# Patient Record
Sex: Female | Born: 1935 | Race: White | Hispanic: No | Smoking: Former smoker
Health system: Southern US, Community
[De-identification: ages and names within clinical notes are randomized; demographics above are authoritative.]

## PROBLEM LIST (undated history)

## (undated) DIAGNOSIS — E119 Type 2 diabetes mellitus without complications: Secondary | ICD-10-CM

## (undated) DIAGNOSIS — F329 Major depressive disorder, single episode, unspecified: Secondary | ICD-10-CM

## (undated) DIAGNOSIS — J45909 Unspecified asthma, uncomplicated: Secondary | ICD-10-CM

## (undated) DIAGNOSIS — Z9989 Dependence on other enabling machines and devices: Secondary | ICD-10-CM

## (undated) DIAGNOSIS — E11319 Type 2 diabetes mellitus with unspecified diabetic retinopathy without macular edema: Secondary | ICD-10-CM

## (undated) DIAGNOSIS — F32A Depression, unspecified: Secondary | ICD-10-CM

## (undated) DIAGNOSIS — E785 Hyperlipidemia, unspecified: Secondary | ICD-10-CM

## (undated) DIAGNOSIS — G4733 Obstructive sleep apnea (adult) (pediatric): Secondary | ICD-10-CM

## (undated) DIAGNOSIS — I10 Essential (primary) hypertension: Secondary | ICD-10-CM

## (undated) DIAGNOSIS — G4700 Insomnia, unspecified: Secondary | ICD-10-CM

## (undated) DIAGNOSIS — M179 Osteoarthritis of knee, unspecified: Secondary | ICD-10-CM

## (undated) DIAGNOSIS — M1710 Unilateral primary osteoarthritis, unspecified knee: Secondary | ICD-10-CM

## (undated) HISTORY — DX: Essential (primary) hypertension: I10

## (undated) HISTORY — DX: Hyperlipidemia, unspecified: E78.5

## (undated) HISTORY — DX: Depression, unspecified: F32.A

## (undated) HISTORY — DX: Obstructive sleep apnea (adult) (pediatric): G47.33

## (undated) HISTORY — DX: Unilateral primary osteoarthritis, unspecified knee: M17.10

## (undated) HISTORY — DX: Dependence on other enabling machines and devices: Z99.89

## (undated) HISTORY — PX: EYE SURGERY: SHX253

## (undated) HISTORY — DX: Osteoarthritis of knee, unspecified: M17.9

## (undated) HISTORY — DX: Major depressive disorder, single episode, unspecified: F32.9

## (undated) HISTORY — DX: Insomnia, unspecified: G47.00

## (undated) HISTORY — DX: Type 2 diabetes mellitus with unspecified diabetic retinopathy without macular edema: E11.319

## (undated) HISTORY — DX: Type 2 diabetes mellitus without complications: E11.9

## (undated) HISTORY — PX: APPENDECTOMY: SHX54

---

## 1999-07-19 ENCOUNTER — Other Ambulatory Visit: Admission: RE | Admit: 1999-07-19 | Discharge: 1999-07-19 | Payer: Self-pay | Admitting: Family Medicine

## 1999-09-15 ENCOUNTER — Other Ambulatory Visit: Admission: RE | Admit: 1999-09-15 | Discharge: 1999-09-15 | Payer: Self-pay | Admitting: Family Medicine

## 2002-02-14 ENCOUNTER — Ambulatory Visit (HOSPITAL_BASED_OUTPATIENT_CLINIC_OR_DEPARTMENT_OTHER): Admission: RE | Admit: 2002-02-14 | Discharge: 2002-02-14 | Payer: Self-pay | Admitting: Family Medicine

## 2002-08-15 ENCOUNTER — Ambulatory Visit (HOSPITAL_BASED_OUTPATIENT_CLINIC_OR_DEPARTMENT_OTHER): Admission: RE | Admit: 2002-08-15 | Discharge: 2002-08-15 | Payer: Self-pay | Admitting: Family Medicine

## 2003-11-02 ENCOUNTER — Ambulatory Visit (HOSPITAL_COMMUNITY): Admission: RE | Admit: 2003-11-02 | Discharge: 2003-11-02 | Payer: Self-pay | Admitting: Family Medicine

## 2004-11-21 ENCOUNTER — Emergency Department (HOSPITAL_COMMUNITY): Admission: EM | Admit: 2004-11-21 | Discharge: 2004-11-21 | Payer: Self-pay | Admitting: Emergency Medicine

## 2004-12-16 ENCOUNTER — Encounter: Admission: RE | Admit: 2004-12-16 | Discharge: 2004-12-16 | Payer: Self-pay | Admitting: Family Medicine

## 2005-01-24 ENCOUNTER — Encounter: Admission: RE | Admit: 2005-01-24 | Discharge: 2005-01-24 | Payer: Self-pay | Admitting: Family Medicine

## 2005-04-20 ENCOUNTER — Ambulatory Visit (HOSPITAL_COMMUNITY): Admission: RE | Admit: 2005-04-20 | Discharge: 2005-04-20 | Payer: Self-pay | Admitting: Family Medicine

## 2005-11-06 ENCOUNTER — Emergency Department (HOSPITAL_COMMUNITY): Admission: EM | Admit: 2005-11-06 | Discharge: 2005-11-06 | Payer: Self-pay | Admitting: Emergency Medicine

## 2005-11-27 ENCOUNTER — Ambulatory Visit: Payer: Self-pay | Admitting: Orthopedic Surgery

## 2005-12-25 ENCOUNTER — Ambulatory Visit: Payer: Self-pay | Admitting: Orthopedic Surgery

## 2009-01-14 ENCOUNTER — Ambulatory Visit (HOSPITAL_COMMUNITY): Admission: RE | Admit: 2009-01-14 | Discharge: 2009-01-14 | Payer: Self-pay | Admitting: Orthopaedic Surgery

## 2010-10-23 ENCOUNTER — Encounter: Payer: Self-pay | Admitting: Family Medicine

## 2012-12-23 ENCOUNTER — Other Ambulatory Visit: Payer: Self-pay | Admitting: Family Medicine

## 2013-04-14 ENCOUNTER — Ambulatory Visit (INDEPENDENT_AMBULATORY_CARE_PROVIDER_SITE_OTHER): Payer: Medicare Other | Admitting: Family Medicine

## 2013-04-14 ENCOUNTER — Encounter: Payer: Self-pay | Admitting: Family Medicine

## 2013-04-14 ENCOUNTER — Other Ambulatory Visit: Payer: Self-pay | Admitting: Family Medicine

## 2013-04-14 VITALS — BP 126/80 | HR 88 | Temp 99.0°F | Resp 22 | Wt 218.0 lb

## 2013-04-14 DIAGNOSIS — R011 Cardiac murmur, unspecified: Secondary | ICD-10-CM

## 2013-04-14 DIAGNOSIS — E785 Hyperlipidemia, unspecified: Secondary | ICD-10-CM | POA: Insufficient documentation

## 2013-04-14 DIAGNOSIS — E119 Type 2 diabetes mellitus without complications: Secondary | ICD-10-CM | POA: Insufficient documentation

## 2013-04-14 DIAGNOSIS — M1710 Unilateral primary osteoarthritis, unspecified knee: Secondary | ICD-10-CM | POA: Insufficient documentation

## 2013-04-14 DIAGNOSIS — G4700 Insomnia, unspecified: Secondary | ICD-10-CM | POA: Insufficient documentation

## 2013-04-14 DIAGNOSIS — F329 Major depressive disorder, single episode, unspecified: Secondary | ICD-10-CM | POA: Insufficient documentation

## 2013-04-14 DIAGNOSIS — I10 Essential (primary) hypertension: Secondary | ICD-10-CM | POA: Insufficient documentation

## 2013-04-14 NOTE — Progress Notes (Signed)
Subjective:    Patient ID: Diana Colon, female    DOB: [birth year], 79 y.o.   MRN: [id number]  HPI Patient has not been seen in 15 months.  She is brought in by her daughter who is concerned because she's not taking care of herself. She has been seen by her eye doctor and found to have significant diabetic retinopathy.  She is here today for diabetes management. She is currently taking Lantus 50 units subcutaneous daily. She is not checking her sugars at all. She has no fasting blood sugars and no two-hour postprandial sugars.  She does report dyspnea on exertion and peripheral edema. He denies dysesthesias or paresthesias in the legs. She denies nausea vomiting diarrhea. She denies polyuria, blurred vision, polydipsia. Past Medical History  Diagnosis Date  . Depression   . Diabetes mellitus without complication   . Hyperlipidemia   . Insomnia   . DJD (degenerative joint disease) of knee   . Hypertension    No current outpatient prescriptions on file prior to visit.   No current facility-administered medications on file prior to visit.   Allergies  Allergen Reactions  . Ace Inhibitors   . Codeine    History   Social History  . Marital Status: Widowed    Spouse Name: N/A    Number of Children: N/A  . Years of Education: N/A   Occupational History  . Not on file.   Social History Main Topics  . Smoking status: Former Smoker    Types: Cigarettes  . Smokeless tobacco: Not on file  . Alcohol Use: No  . Drug Use: No  . Sexually Active: Not on file   Other Topics Concern  . Not on file   Social History Narrative  . No narrative on file   .    Review of Systems  All other systems reviewed and are negative.       Objective:   Physical Exam  Vitals reviewed. Constitutional: She is oriented to person, place, and time. She appears well-developed and well-nourished.  Cardiovascular: Normal rate and intact distal pulses.   Murmur (3/ 6 systolic ejection murmur  radiating into both carotid arteries) heard. Pulmonary/Chest: Effort normal and breath sounds normal. No respiratory distress. She has no wheezes. She has no rales. She exhibits no tenderness.  Abdominal: Soft. Bowel sounds are normal. She exhibits no distension and no mass. There is no tenderness. There is no rebound and no guarding.  Neurological: She is alert and oriented to person, place, and time. She has normal reflexes. She displays normal reflexes. No cranial nerve deficit. Coordination normal.  Skin: Skin is warm. No rash noted. No erythema. No pallor.   diabetic foot exam is performed.       Assessment & Plan:  1. Type II or unspecified type diabetes mellitus without mention of complication, uncontrolled Check hemoglobin A1c to get an idea of how well controlled her diabetes is.  Also check a urine microalbumin. If it is positive, I would start an ARB.  I also recommend aspirin 81 mg by mouth daily. She will need to return fasting to check her lipid panel.  - COMPLETE METABOLIC PANEL WITH GFR - CBC with Differential - Hemoglobin A1c - Microalbumin, urine  2. Undiagnosed cardiac murmurs Patient has aortic stenosis. I obtain a 2-D echocardiogram to evaluate objectively.  This may be the source of the patient's peripheral edema.    She is overdue for a mammogram, shingles shot, colonoscopy, and bone density. However  the patient is noncompliant and has not followed up as recommended.  I will defer these tests for now until her diabetes is stabilized and her cardiac condition better characterized. - 2D Echocardiogram with contrast; Future

## 2013-04-15 ENCOUNTER — Other Ambulatory Visit: Payer: Self-pay | Admitting: Family Medicine

## 2013-04-15 DIAGNOSIS — R011 Cardiac murmur, unspecified: Secondary | ICD-10-CM

## 2013-04-15 LAB — COMPLETE METABOLIC PANEL WITH GFR
ALT: 12 U/L (ref 0–35)
AST: 15 U/L (ref 0–37)
Alkaline Phosphatase: 50 U/L (ref 39–117)
CO2: 29 mEq/L (ref 19–32)
Creat: 1.12 mg/dL — ABNORMAL HIGH (ref 0.50–1.10)
GFR, Est African American: 53 mL/min — ABNORMAL LOW
Sodium: 147 mEq/L — ABNORMAL HIGH (ref 135–145)
Total Bilirubin: 0.4 mg/dL (ref 0.3–1.2)
Total Protein: 6.2 g/dL (ref 6.0–8.3)

## 2013-04-15 LAB — CBC WITH DIFFERENTIAL/PLATELET
Eosinophils Absolute: 0.9 10*3/uL — ABNORMAL HIGH (ref 0.0–0.7)
HCT: 40 % (ref 36.0–46.0)
Hemoglobin: 13.1 g/dL (ref 12.0–15.0)
Lymphs Abs: 1.7 10*3/uL (ref 0.7–4.0)
MCH: 30 pg (ref 26.0–34.0)
MCHC: 32.8 g/dL (ref 30.0–36.0)
Monocytes Absolute: 0.6 10*3/uL (ref 0.1–1.0)
Monocytes Relative: 6 % (ref 3–12)
Neutrophils Relative %: 66 % (ref 43–77)
RBC: 4.37 MIL/uL (ref 3.87–5.11)

## 2013-04-15 LAB — HEMOGLOBIN A1C
Hgb A1c MFr Bld: 6.2 % — ABNORMAL HIGH (ref <5.7)
Mean Plasma Glucose: 131 mg/dL — ABNORMAL HIGH (ref <117)

## 2013-04-16 ENCOUNTER — Other Ambulatory Visit: Payer: Self-pay | Admitting: Family Medicine

## 2013-04-18 NOTE — Progress Notes (Signed)
[facility] 04/16/13 and today

## 2013-04-22 ENCOUNTER — Other Ambulatory Visit (HOSPITAL_COMMUNITY): Payer: Self-pay

## 2013-04-23 ENCOUNTER — Ambulatory Visit (HOSPITAL_COMMUNITY): Payer: Medicare Other | Attending: Family Medicine | Admitting: Radiology

## 2013-04-23 VITALS — Ht 62.0 in

## 2013-04-23 DIAGNOSIS — R0609 Other forms of dyspnea: Secondary | ICD-10-CM | POA: Insufficient documentation

## 2013-04-23 DIAGNOSIS — I10 Essential (primary) hypertension: Secondary | ICD-10-CM | POA: Insufficient documentation

## 2013-04-23 DIAGNOSIS — E119 Type 2 diabetes mellitus without complications: Secondary | ICD-10-CM | POA: Insufficient documentation

## 2013-04-23 DIAGNOSIS — R0989 Other specified symptoms and signs involving the circulatory and respiratory systems: Secondary | ICD-10-CM | POA: Insufficient documentation

## 2013-04-23 DIAGNOSIS — R011 Cardiac murmur, unspecified: Secondary | ICD-10-CM

## 2013-04-23 DIAGNOSIS — E785 Hyperlipidemia, unspecified: Secondary | ICD-10-CM | POA: Insufficient documentation

## 2013-04-23 DIAGNOSIS — E669 Obesity, unspecified: Secondary | ICD-10-CM | POA: Insufficient documentation

## 2013-04-23 NOTE — Progress Notes (Signed)
Echocardiogram performed.  

## 2013-04-28 ENCOUNTER — Other Ambulatory Visit: Payer: Self-pay | Admitting: Family Medicine

## 2013-04-28 MED ORDER — AMLODIPINE BESYLATE 5 MG PO TABS: 5.0000 mg | ORAL_TABLET | Freq: Every day | ORAL | Status: DC

## 2013-04-28 NOTE — Telephone Encounter (Signed)
Rx Refilled  

## 2013-05-19 ENCOUNTER — Telehealth: Payer: Self-pay | Admitting: Family Medicine

## 2013-05-19 MED ORDER — INSULIN GLARGINE 100 UNIT/ML SOLOSTAR PEN: PEN_INJECTOR | SUBCUTANEOUS | Status: DC

## 2013-05-19 NOTE — Telephone Encounter (Signed)
Lantus Solostar Pens (pt wants #45 = 3 boxes of 5 pens each (3mL)

## 2013-05-19 NOTE — Telephone Encounter (Signed)
Rx Refilled  

## 2013-09-12 ENCOUNTER — Ambulatory Visit (HOSPITAL_COMMUNITY)
Admission: RE | Admit: 2013-09-12 | Discharge: 2013-09-12 | Disposition: A | Discharge status: Home / Self Care | Payer: Medicare Other | Source: Ambulatory Visit | Attending: Family Medicine | Admitting: Family Medicine

## 2013-09-12 ENCOUNTER — Ambulatory Visit (INDEPENDENT_AMBULATORY_CARE_PROVIDER_SITE_OTHER): Payer: Medicare Other | Admitting: Family Medicine

## 2013-09-12 ENCOUNTER — Encounter: Payer: Self-pay | Admitting: Family Medicine

## 2013-09-12 VITALS — BP 130/78 | HR 72 | Temp 98.6°F | Resp 18 | Wt 226.0 lb

## 2013-09-12 DIAGNOSIS — R059 Cough, unspecified: Secondary | ICD-10-CM

## 2013-09-12 DIAGNOSIS — K219 Gastro-esophageal reflux disease without esophagitis: Secondary | ICD-10-CM

## 2013-09-12 DIAGNOSIS — R05 Cough: Secondary | ICD-10-CM | POA: Insufficient documentation

## 2013-09-12 DIAGNOSIS — J9819 Other pulmonary collapse: Secondary | ICD-10-CM | POA: Insufficient documentation

## 2013-09-12 DIAGNOSIS — Z23 Encounter for immunization: Secondary | ICD-10-CM

## 2013-09-12 DIAGNOSIS — E119 Type 2 diabetes mellitus without complications: Secondary | ICD-10-CM

## 2013-09-12 DIAGNOSIS — Z87891 Personal history of nicotine dependence: Secondary | ICD-10-CM | POA: Insufficient documentation

## 2013-09-12 DIAGNOSIS — I10 Essential (primary) hypertension: Secondary | ICD-10-CM | POA: Insufficient documentation

## 2013-09-12 DIAGNOSIS — J45909 Unspecified asthma, uncomplicated: Secondary | ICD-10-CM | POA: Insufficient documentation

## 2013-09-12 DIAGNOSIS — I517 Cardiomegaly: Secondary | ICD-10-CM | POA: Insufficient documentation

## 2013-09-12 DIAGNOSIS — G4733 Obstructive sleep apnea (adult) (pediatric): Secondary | ICD-10-CM

## 2013-09-12 LAB — CBC WITH DIFFERENTIAL/PLATELET
Basophils Absolute: 0.1 10*3/uL (ref 0.0–0.1)
Basophils Relative: 1 % (ref 0–1)
Eosinophils Absolute: 1 10*3/uL — ABNORMAL HIGH (ref 0.0–0.7)
Eosinophils Relative: 10 % — ABNORMAL HIGH (ref 0–5)
HCT: 38.2 % (ref 36.0–46.0)
MCHC: 33.8 g/dL (ref 30.0–36.0)
MCV: 89 fL (ref 78.0–100.0)
Monocytes Absolute: 0.7 10*3/uL (ref 0.1–1.0)
Platelets: 301 10*3/uL (ref 150–400)
RDW: 12.9 % (ref 11.5–15.5)

## 2013-09-12 LAB — COMPLETE METABOLIC PANEL WITH GFR
AST: 15 U/L (ref 0–37)
Albumin: 3.3 g/dL — ABNORMAL LOW (ref 3.5–5.2)
Alkaline Phosphatase: 53 U/L (ref 39–117)
Calcium: 9.1 mg/dL (ref 8.4–10.5)
Chloride: 101 mEq/L (ref 96–112)
Glucose, Bld: 124 mg/dL — ABNORMAL HIGH (ref 70–99)
Potassium: 4.3 mEq/L (ref 3.5–5.3)
Sodium: 139 mEq/L (ref 135–145)
Total Protein: 6.2 g/dL (ref 6.0–8.3)

## 2013-09-12 LAB — HEMOGLOBIN A1C
Hgb A1c MFr Bld: 7.3 % — ABNORMAL HIGH (ref <5.7)
Mean Plasma Glucose: 163 mg/dL — ABNORMAL HIGH (ref <117)

## 2013-09-12 LAB — LIPID PANEL: LDL Cholesterol: 109 mg/dL — ABNORMAL HIGH (ref 0–99)

## 2013-09-12 MED ORDER — PANTOPRAZOLE SODIUM 40 MG PO TBEC: 40.0000 mg | DELAYED_RELEASE_TABLET | Freq: Two times a day (BID) | ORAL | Status: DC

## 2013-09-12 NOTE — Progress Notes (Signed)
Subjective:    Patient ID: Diana Colon, female    DOB: January 23, 1929, 79 y.o.   MRN: [id number]  HPI  Patient presents today for routine followup. Of note she complains of a cough that is nonproductive the last 3 years. She states the cough is worse first thing in the morning. She denies hemoptysis, weight loss, fevers, or night sweats. She denies any shortness of breath. She does report daily reflux that is severe. She denies a postnasal drip. She has a remote history of tobacco abuse but this was over 40 years ago and was only for a short period time. She denies any wheezing or history of asthma. At her last office visit her hemoglobin A1c was found to be 6.2 which is as well controlled. This is in spite of the fact the patient is extremely noncompliant and never checks her blood sugars. Her blood pressure currently well controlled 130/78. She is due for a flu shot as well as Prevnar 13. She is also due for her fasting lipid panel. In 2003, she was found to have obstructive sleep apnea sleep study. She never got the equipment and she has not been using it. Now she complains of hypersomnolence, snoring, and fatigue. She is interested in being rechecked for sleep apnea so that he can be treated.  Over 11 years ago she was titrated to a continuous water pressure of 8. Past Medical History  Diagnosis Date  . Depression   . Diabetes mellitus without complication   . Hyperlipidemia   . Insomnia   . DJD (degenerative joint disease) of knee   . Hypertension    No past surgical history on file. Current Outpatient Prescriptions on File Prior to Visit  Medication Sig Dispense Refill  . amLODipine (NORVASC) 5 MG tablet Take 1 tablet (5 mg total) by mouth daily.  30 tablet  5  . aspirin 81 MG tablet Take 81 mg by mouth daily.      . Cyanocobalamin (VITAMIN B 12 PO) Take by mouth.      . escitalopram (LEXAPRO) 10 MG tablet 1 po qd  30 tablet  5  . Insulin Glargine (LANTUS SOLOSTAR) 100 UNIT/ML SOPN INJECT  50 UNITS NIGHTLY AT BEDTIME  45 pen  1   No current facility-administered medications on file prior to visit.   Allergies  Allergen Reactions  . Ace Inhibitors   . Codeine    History   Social History  . Marital Status: Widowed    Spouse Name: N/A    Number of Children: N/A  . Years of Education: N/A   Occupational History  . Not on file.   Social History Main Topics  . Smoking status: Former Smoker    Types: Cigarettes  . Smokeless tobacco: Not on file  . Alcohol Use: No  . Drug Use: No  . Sexual Activity: Not on file   Other Topics Concern  . Not on file   Social History Narrative  . No narrative on file     Review of Systems  All other systems reviewed and are negative.       Objective:   Physical Exam  Vitals reviewed. Constitutional: She is oriented to person, place, and time. She appears well-developed and well-nourished.  Eyes: Conjunctivae are normal. Pupils are equal, round, and reactive to light.  Neck: Normal range of motion. No JVD present. No thyromegaly present.  Cardiovascular: Normal rate, regular rhythm and normal heart sounds.   No murmur heard. Pulmonary/Chest: Effort  normal and breath sounds normal. No respiratory distress. She has no wheezes. She has no rales. She exhibits no tenderness.  Abdominal: Soft. Bowel sounds are normal. She exhibits no distension and no mass. There is no tenderness. There is no rebound and no guarding.  Musculoskeletal: She exhibits no edema.  Lymphadenopathy:    She has no cervical adenopathy.  Neurological: She is alert and oriented to person, place, and time.          Assessment & Plan:  1. GERD (gastroesophageal reflux disease) Begin protonix 40 mg by mouth twice a day.  I believe this is likely the cause of patient's chronic cough. I would like to recheck her back in one month - pantoprazole (PROTONIX) 40 MG tablet; Take 1 tablet (40 mg total) by mouth 2 (two) times daily.  Dispense: 60 tablet;  Refill: 3  2. Cough I believe laryngo-esophageal reflux as most likely cause for her chronic cough. I will start PPI twice daily and recheck the patient in one month. I will also obtain a chest - DG Chest 2 View; Future  3. Type II or unspecified type diabetes mellitus without mention of complication, not stated as uncontrolled This is complicated by extreme noncompliance. Functionally last time her A1c was well controlled. I will check a hemoglobin A1c today. Her goal hemoglobin A1c is less than 7. Also recommended patient should receive a flu shot as well as Prevnar 13. She agreed to both vaccinations the - COMPLETE METABOLIC PANEL WITH GFR - CBC with Differential - Lipid panel - Hemoglobin A1c  4. Need for prophylactic vaccination and inoculation against influenza - Flu Vaccine QUAD 36+ mos IM  5. Need for prophylactic vaccination against Streptococcus pneumoniae (pneumococcus) - Pneumococcal conjugate vaccine 13-valent IM  6. Obstructive sleep apnea I will schedule patient for a sleep study.  I urged compliance with therapy. - Ambulatory referral to Sleep Studies

## 2013-09-16 ENCOUNTER — Other Ambulatory Visit: Payer: Self-pay | Admitting: Family Medicine

## 2013-09-16 DIAGNOSIS — G4733 Obstructive sleep apnea (adult) (pediatric): Secondary | ICD-10-CM

## 2013-09-17 ENCOUNTER — Other Ambulatory Visit: Payer: Self-pay | Admitting: Family Medicine

## 2013-09-17 MED ORDER — PRAVASTATIN SODIUM 20 MG PO TABS: 20.0000 mg | ORAL_TABLET | Freq: Every day | ORAL | Status: DC

## 2013-09-17 NOTE — Telephone Encounter (Signed)
New med sent to pharm. 

## 2013-10-22 ENCOUNTER — Other Ambulatory Visit: Payer: Self-pay | Admitting: Family Medicine

## 2013-10-23 ENCOUNTER — Ambulatory Visit (HOSPITAL_BASED_OUTPATIENT_CLINIC_OR_DEPARTMENT_OTHER): Payer: Medicare Other | Attending: Family Medicine | Admitting: Radiology

## 2013-10-23 VITALS — Ht 61.0 in | Wt 226.0 lb

## 2013-10-23 DIAGNOSIS — R0989 Other specified symptoms and signs involving the circulatory and respiratory systems: Secondary | ICD-10-CM | POA: Insufficient documentation

## 2013-10-23 DIAGNOSIS — R0609 Other forms of dyspnea: Secondary | ICD-10-CM | POA: Insufficient documentation

## 2013-10-23 DIAGNOSIS — G4733 Obstructive sleep apnea (adult) (pediatric): Secondary | ICD-10-CM

## 2013-10-23 DIAGNOSIS — Z6841 Body Mass Index (BMI) 40.0 and over, adult: Secondary | ICD-10-CM | POA: Insufficient documentation

## 2013-10-23 DIAGNOSIS — G4761 Periodic limb movement disorder: Secondary | ICD-10-CM | POA: Insufficient documentation

## 2013-10-25 DIAGNOSIS — G4733 Obstructive sleep apnea (adult) (pediatric): Secondary | ICD-10-CM

## 2013-10-25 NOTE — Sleep Study (Signed)
   NAME: Diana Colon DATE OF BIRTH:  1929/03/25 MEDICAL RECORD NUMBER [id number]  LOCATION: [facility]  PHYSICIAN: YOUNG,CLINTON D  DATE OF STUDY: 10/23/2013  SLEEP STUDY TYPE: Nocturnal Polysomnogram               REFERRING PHYSICIAN: Susy Frizzle, MD  INDICATION FOR STUDY: Hypersomnia with sleep apnea  EPWORTH SLEEPINESS SCORE:   14/24 HEIGHT: 5\' 1"  (154.9 cm)  WEIGHT: 226 lb (102.513 kg)    Body mass index is 42.72 kg/(m^2).  NECK SIZE: 17 in.  MEDICATIONS: Charted for review  SLEEP ARCHITECTURE: Split study protocol. During the diagnostic phase, total sleep time 122 minutes with sleep efficiency 57%. Stage I was 10.2%, stage II 89.8%, stage III and REM were absent. Sleep latency 22.5 minutes, awake after sleep onset 70 minutes, arousal index 47.7, bedtime medication: None.  RESPIRATORY DATA: Apnea hypopnea index (AHI) 36.9 per hour. 75 events were scored including 6 obstructive apneas, 1 central apnea, 68 hypopneas. All events were associated with supine sleep position. CPAP was titrated to 10 CWP, AHI 0.6 per hour. She wore a medium ResMed air fit F10 fullface mask with heated humidifier.  OXYGEN DATA: Moderate snoring with oxygen desaturation to a nadir of 85% on room air. With CPAP control, snoring was prevented and mean oxygen saturation held 93.9% on room air.  CARDIAC DATA: Normal sinus rhythm  MOVEMENT/PARASOMNIA: Limb jerks with sleep disturbance were noted mainly during the titration phase, which is common. A total of 24 limb jerks were counted of which 16 were associated with arousal or awakening for periodic limb movement with arousal index of 7 per hour.  IMPRESSION/ RECOMMENDATION:   1) Severe obstructive sleep apnea/hypopneas syndrome, AHI 36.9 per hour with supine events. Moderate snoring with oxygen desaturation to a nadir of 85% on room air. 2) Successful CPAP titration to 10 CWP, AHI 0.6 per hour. She wore a medium ResMed air fit F. 10  fullface mask with heated to notify her. Snoring was prevented and mean oxygen saturation held 93.9% on room air. 3) Mild periodic limb movement with sleep disturbance was noted during the titration phase. A total of 24 limb jerks were counted of which 16 were associated with arousal or awakening for periodic limb movement with arousal index of 7 per hour. This is commonly a reflection of sleep disturbance by adjustment of CPAP. If it persists in the home environment then specific therapy such as a trial of Requip or Mirapex might be considered if clinically appropriate.   Signed Baird Lyons M.D. Deneise Lever Diplomate, American Board of Sleep Medicine  ELECTRONICALLY SIGNED ON:  10/25/2013, 12:23 PM [place] PH: [phone]   FX: [phone] [place]

## 2013-10-27 ENCOUNTER — Encounter: Payer: Self-pay | Admitting: Family Medicine

## 2013-10-27 DIAGNOSIS — Z9989 Dependence on other enabling machines and devices: Secondary | ICD-10-CM

## 2013-10-27 DIAGNOSIS — G4733 Obstructive sleep apnea (adult) (pediatric): Secondary | ICD-10-CM | POA: Insufficient documentation

## 2013-11-27 ENCOUNTER — Other Ambulatory Visit: Payer: Self-pay | Admitting: Family Medicine

## 2014-01-05 ENCOUNTER — Other Ambulatory Visit: Payer: Self-pay | Admitting: Family Medicine

## 2014-01-05 NOTE — Telephone Encounter (Signed)
Refill appropriate and filled per protocol. 

## 2014-01-15 ENCOUNTER — Encounter: Payer: Self-pay | Admitting: Family Medicine

## 2014-01-15 ENCOUNTER — Ambulatory Visit (INDEPENDENT_AMBULATORY_CARE_PROVIDER_SITE_OTHER): Payer: Medicare Other | Admitting: Family Medicine

## 2014-01-15 VITALS — BP 128/84 | HR 84 | Temp 98.4°F | Resp 22 | Ht 62.5 in | Wt 221.0 lb

## 2014-01-15 DIAGNOSIS — G4733 Obstructive sleep apnea (adult) (pediatric): Secondary | ICD-10-CM

## 2014-01-15 NOTE — Progress Notes (Signed)
   Subjective:    Patient ID: Diana Colon, female    DOB: 02-Apr-1929, 80 y.o.   MRN: [id number]  HPI  Patient has a history of obstructive sleep apnea. She was titrated to 10 cm of water pressure of CPAP.  The patient has worn the machine for one month.  Her compliance report shows 27/30 days she will CPAP. For more than 24 days she or greater than 4 hours. Her average usage was 6 hours and 24 minutes. The patient is given an honest attempt to the machine. Per se she complains it causes her gums to bleed and dried her mouth out. Humidifier on the device does not appear to be working sufficiently. She states that she does better when she does machine. Past Medical History  Diagnosis Date  . Depression   . Diabetes mellitus without complication   . Hyperlipidemia   . Insomnia   . DJD (degenerative joint disease) of knee   . Hypertension   . OSA on CPAP     10 cwp   Current Outpatient Prescriptions on File Prior to Visit  Medication Sig Dispense Refill  . amLODipine (NORVASC) 5 MG tablet TAKE ONE TABLET BY MOUTH DAILY.  30 tablet  5  . aspirin 81 MG tablet Take 81 mg by mouth daily.      . Cyanocobalamin (VITAMIN B 12 PO) Take by mouth.      . escitalopram (LEXAPRO) 10 MG tablet TAKE ONE TABLET BY MOUTH DAILY.  30 tablet  5  . pantoprazole (PROTONIX) 40 MG tablet Take 1 tablet (40 mg total) by mouth 2 (two) times daily.  60 tablet  3  . pravastatin (PRAVACHOL) 20 MG tablet Take 1 tablet (20 mg total) by mouth daily.  30 tablet  3   No current facility-administered medications on file prior to visit.   Allergies  Allergen Reactions  . Ace Inhibitors   . Codeine    History   Social History  . Marital Status: Widowed    Spouse Name: N/A    Number of Children: N/A  . Years of Education: N/A   Occupational History  . Not on file.   Social History Main Topics  . Smoking status: Former Smoker    Types: Cigarettes  . Smokeless tobacco: Not on file  . Alcohol Use: No  . Drug  Use: No  . Sexual Activity: Not on file   Other Topics Concern  . Not on file   Social History Narrative  . No narrative on file   No family history on file.   Review of Systems  All other systems reviewed and are negative.      Objective:   Physical Exam  Vitals reviewed. Cardiovascular: Normal rate and regular rhythm.   Pulmonary/Chest: Effort normal and breath sounds normal.          Assessment & Plan:  1. Obstructive sleep apnea I explained to the patient the risk of untreated sleep apnea. I as the patient can't be compliant with wearing the CPAP machine. I recommended she call her medical equipment supplier and discussed the problems she is having with machine with him to see if they have an alternative device she can try.

## 2014-01-19 ENCOUNTER — Encounter: Payer: Self-pay | Admitting: Family Medicine

## 2014-03-19 ENCOUNTER — Encounter (HOSPITAL_COMMUNITY): Payer: Self-pay | Admitting: Emergency Medicine

## 2014-03-19 ENCOUNTER — Emergency Department (HOSPITAL_COMMUNITY)
Admission: EM | Admit: 2014-03-19 | Discharge: 2014-03-19 | Disposition: A | Discharge status: Home / Self Care | Payer: Medicare Other | Attending: Emergency Medicine | Admitting: Emergency Medicine

## 2014-03-19 ENCOUNTER — Emergency Department (HOSPITAL_COMMUNITY): Payer: Medicare Other

## 2014-03-19 DIAGNOSIS — W460XXA Contact with hypodermic needle, initial encounter: Secondary | ICD-10-CM | POA: Diagnosis not present

## 2014-03-19 DIAGNOSIS — G4733 Obstructive sleep apnea (adult) (pediatric): Secondary | ICD-10-CM | POA: Insufficient documentation

## 2014-03-19 DIAGNOSIS — Y929 Unspecified place or not applicable: Secondary | ICD-10-CM | POA: Insufficient documentation

## 2014-03-19 DIAGNOSIS — Z79899 Other long term (current) drug therapy: Secondary | ICD-10-CM | POA: Diagnosis not present

## 2014-03-19 DIAGNOSIS — S2095XA Superficial foreign body of unspecified parts of thorax, initial encounter: Secondary | ICD-10-CM | POA: Diagnosis present

## 2014-03-19 DIAGNOSIS — Y9389 Activity, other specified: Secondary | ICD-10-CM | POA: Diagnosis not present

## 2014-03-19 DIAGNOSIS — I10 Essential (primary) hypertension: Secondary | ICD-10-CM | POA: Insufficient documentation

## 2014-03-19 DIAGNOSIS — E119 Type 2 diabetes mellitus without complications: Secondary | ICD-10-CM | POA: Diagnosis not present

## 2014-03-19 DIAGNOSIS — Z9981 Dependence on supplemental oxygen: Secondary | ICD-10-CM | POA: Diagnosis not present

## 2014-03-19 DIAGNOSIS — Z8739 Personal history of other diseases of the musculoskeletal system and connective tissue: Secondary | ICD-10-CM | POA: Diagnosis not present

## 2014-03-19 DIAGNOSIS — Z7982 Long term (current) use of aspirin: Secondary | ICD-10-CM | POA: Insufficient documentation

## 2014-03-19 DIAGNOSIS — F329 Major depressive disorder, single episode, unspecified: Secondary | ICD-10-CM | POA: Insufficient documentation

## 2014-03-19 DIAGNOSIS — F3289 Other specified depressive episodes: Secondary | ICD-10-CM | POA: Insufficient documentation

## 2014-03-19 DIAGNOSIS — Z794 Long term (current) use of insulin: Secondary | ICD-10-CM | POA: Insufficient documentation

## 2014-03-19 DIAGNOSIS — Z87891 Personal history of nicotine dependence: Secondary | ICD-10-CM | POA: Diagnosis not present

## 2014-03-19 DIAGNOSIS — M795 Residual foreign body in soft tissue: Secondary | ICD-10-CM

## 2014-03-19 NOTE — ED Notes (Signed)
Patient states that insulin needle broke off in abdomen. Unable to visualize at triage.

## 2014-03-19 NOTE — Discharge Instructions (Signed)
Call Dr Adline Mango office tomorrow to get an appointment to discuss removing the needle in your abdomen. Return to the ED if you get a fever, increased redness, swelling, pain, or you have drainage of pus.

## 2014-03-19 NOTE — ED Provider Notes (Signed)
CSN: [id number]     Arrival date & time 03/19/14  1922 History   First MD Initiated Contact with Patient 03/19/14 1929     Chief Complaint  Patient presents with  . Foreign Body     (Consider location/radiation/quality/duration/timing/severity/associated sxs/prior Treatment) HPI Patient reports about an hour ago she was injecting herself with her evening insulin. She states she was having trouble getting the plunger to go down and when she took the needle out of the skin there was no needle on the end of the syringe. She indicates the area is in her left upper abdomen. There are 2 small red dots in that area. She states this has never happened before.  PCP Dr Dennard Schaumann  Past Medical History  Diagnosis Date  . Depression   . Diabetes mellitus without complication   . Hyperlipidemia   . Insomnia   . DJD (degenerative joint disease) of knee   . Hypertension   . OSA on CPAP     10 cwp   Past Surgical History  Procedure Laterality Date  . Eye surgery    . Appendectomy     No family history on file. History  Substance Use Topics  . Smoking status: Former Smoker    Types: Cigarettes  . Smokeless tobacco: Not on file  . Alcohol Use: No   Lives at home Lives alone  OB History   Grav Para Term Preterm Abortions TAB SAB Ect Mult Living                 Review of Systems  All other systems reviewed and are negative.     Allergies  Ace inhibitors and Codeine  Home Medications   Prior to Admission medications   Medication Sig Start Date End Date Taking? Authorizing Provider  amLODipine (NORVASC) 5 MG tablet Take 5 mg by mouth daily.   Yes Historical Provider, MD  aspirin 81 MG tablet Take 81 mg by mouth daily.   Yes Historical Provider, MD  Coenzyme Q10 (CO Q 10 PO) Take 1 tablet by mouth daily.   Yes Historical Provider, MD  CORAL CALCIUM PO Take 1 tablet by mouth daily.   Yes Historical Provider, MD  Cyanocobalamin (B-12 PO) Place 1 tablet under the tongue daily.    Yes Historical Provider, MD  escitalopram (LEXAPRO) 10 MG tablet Take 10 mg by mouth daily.   Yes Historical Provider, MD  Insulin Glargine (LANTUS SOLOSTAR) 100 UNIT/ML Solostar Pen Inject 50 Units into the skin every morning.   Yes Historical Provider, MD   BP 159/75  Pulse 72  Temp(Src) 98.5 F (36.9 C) (Oral)  Resp 24  Ht 5\' 2"  (1.575 m)  Wt 217 lb 7 oz (98.629 kg)  BMI 39.76 kg/m2  SpO2 95%  Vital signs normal   Physical Exam  Nursing note and vitals reviewed. Constitutional: She is oriented to person, place, and time. She appears well-developed and well-nourished.  Non-toxic appearance. She does not appear ill. No distress.  HENT:  Head: Normocephalic and atraumatic.  Right Ear: External ear normal.  Left Ear: External ear normal.  Nose: Nose normal. No mucosal edema or rhinorrhea.  Mouth/Throat: Mucous membranes are normal. No dental abscesses or uvula swelling. Oropharyngeal exudate present.  Eyes: Conjunctivae and EOM are normal. Pupils are equal, round, and reactive to light.  Neck: Normal range of motion and full passive range of motion without pain. Neck supple.  Pulmonary/Chest: Effort normal. No respiratory distress. She has no rhonchi. She exhibits no  crepitus.  Abdominal: Soft. Normal appearance and bowel sounds are normal. She exhibits no distension. There is no tenderness. There is no rebound and no guarding.  There are 2 small red dots in the left upper quadrant. Patient states that is where she injected her insulin. When I palpate the area I not feel an obvious foreign body. Patient does seem to feel some tenderness at times when it is palpated. There is nothing visible in the puncture site.  Musculoskeletal: Normal range of motion. She exhibits no edema and no tenderness.  Moves all extremities well.   Neurological: She is alert and oriented to person, place, and time. She has normal strength. No cranial nerve deficit.  Skin: Skin is warm, dry and intact. No rash  noted. No erythema. No pallor.  Psychiatric: She has a normal mood and affect. Her speech is normal and behavior is normal. Her mood appears not anxious.    ED Course  Procedures (including critical care time)     EMERGENCY DEPARTMENT US SOFT TISSUE INTERPRETATION "Study: Limited Ultrasound of the noted body part in comments below"  INDICATIONS: Possible needle foreign body in subcutaneous tissues Multiple views of the body part are obtained with a multi-frequency linear probe  PERFORMED BY:  Myself  IMAGES ARCHIVED?: Yes  SIDE:Left  BODY PART:Abdominal wall  FINDINGS: No obvious foreign body seen  LIMITATIONS:    INTERPRETATION:  No abnormal findings noted  COMMENT:     Labs Review Labs Reviewed - No data to display  Imaging Review Dg Abd 2 Views  03/19/2014   CLINICAL DATA:  Patient's concern that she broke an insulin needle off in the soft tissue of the left side upper abdomen.  EXAM: ABDOMEN - 2 VIEW  COMPARISON:  None.  FINDINGS: Bowel gas pattern is nonobstructive. A linear density overlies the lower chest on the lateral view but is favored to represent artifact. Correlation with history and physical exam recommended. Repeat views or additional imaging can be performed as needed to evaluate this further. A similar density is identified in the soft tissues of the left lower quadrant, also favored to be artifactual.  Chronic wedge compression fracture at T12.  IMPRESSION: 1. Linear density overlying the lower chest as described. This is favored to be artifactual. 2. As needed, further evaluation can be performed.   Electronically Signed   By: Shon Hale M.D.   On: 03/19/2014 20:48     EKG Interpretation None      MDM   Final diagnoses:  Foreign body (FB) in soft tissue    Plan discharge   Rolland Porter, MD, Alanson Aly, MD 03/19/14 2316

## 2014-08-13 ENCOUNTER — Other Ambulatory Visit: Payer: Self-pay | Admitting: Family Medicine

## 2014-08-13 NOTE — Telephone Encounter (Signed)
Refill appropriate and filled per protocol. 

## 2014-09-08 ENCOUNTER — Ambulatory Visit (INDEPENDENT_AMBULATORY_CARE_PROVIDER_SITE_OTHER): Payer: Medicare Other | Admitting: *Deleted

## 2014-09-08 DIAGNOSIS — Z23 Encounter for immunization: Secondary | ICD-10-CM

## 2014-10-26 ENCOUNTER — Telehealth: Payer: Self-pay | Admitting: Family Medicine

## 2014-10-26 MED ORDER — LANCETS 30G MISC: 1.0000 | Freq: Two times a day (BID) | Status: DC

## 2014-10-26 MED ORDER — BLOOD GLUCOSE MONITOR KIT: PACK | Status: DC

## 2014-10-26 MED ORDER — GLUCOSE BLOOD VI STRP: 1.0000 | ORAL_STRIP | Freq: Two times a day (BID) | Status: DC

## 2014-10-26 NOTE — Telephone Encounter (Signed)
Daughter calls.  Pt needs diabetic testing supplies to [employer].  Also beginning 11/02/14  Will have [employer]  ID# [id number].  Is seeing Retinal specialist on 11/04/14 for eye exam for DMV.  Needs approval.  Request to referral coordinator.  Seeing Dr Posey Pronto at [facility].

## 2014-10-27 ENCOUNTER — Other Ambulatory Visit: Payer: Self-pay | Admitting: Family Medicine

## 2014-10-27 MED ORDER — LANCETS 30G MISC: 1.0000 | Freq: Two times a day (BID) | Status: AC

## 2014-10-27 MED ORDER — BLOOD GLUCOSE MONITOR KIT: PACK | Status: AC

## 2014-10-27 MED ORDER — GLUCOSE BLOOD VI STRP: 1.0000 | ORAL_STRIP | Freq: Two times a day (BID) | Status: DC

## 2014-10-27 NOTE — Telephone Encounter (Signed)
Daughter called yesterday to get order for diabetic supplies for mother.  RX was faxed to [place] per their request.  Today have received Rx back.  [place] not contracted with [employer].  Have asked daughter to call [facility] and find out who is.  Let me know and we can resend SF

## 2014-10-27 NOTE — Telephone Encounter (Signed)
Pt has appt on Feb 3 Dr. Jalene Mullet NPI [id number] DX: [phone] faxed [employer] referral to [phone]

## 2014-10-27 NOTE — Telephone Encounter (Signed)
Pt can supplies directly from [facility].  They have sent form and we have completed and returned

## 2014-10-27 NOTE — Telephone Encounter (Signed)
Daughter called back. Pt can get supplies directly from [facility].  [employer] has faxed form.  It has been completed and returned.

## 2014-11-02 NOTE — Telephone Encounter (Signed)
Pt insurance does not kick in until today Feb 1, tried to submit referral thru acuity connect did not recognize pt, I faxed over the submission thru [employer] for authorization. Pending authorization.

## 2014-11-04 NOTE — Telephone Encounter (Signed)
Received authorization from [employer] with authorization [id number] to Dr. Mellody Life [facility]   Dx: [id number] 2 diabetes w severe nonprolf diabetic rtnop w mascular edema  Number of visits: 6  Start Date: 11/04/14 ending date 05/03/15  Paper copy faxed to Retinal specialist

## 2014-11-04 NOTE — Telephone Encounter (Signed)
Went into acuity connect to check to see if patients [employer] is in and submitted referral for Dr. Mellody Life [facility], authorization is still pending

## 2014-11-09 ENCOUNTER — Ambulatory Visit (INDEPENDENT_AMBULATORY_CARE_PROVIDER_SITE_OTHER): Payer: Commercial Managed Care - HMO | Admitting: Family Medicine

## 2014-11-09 ENCOUNTER — Encounter: Payer: Self-pay | Admitting: Family Medicine

## 2014-11-09 VITALS — BP 130/72 | HR 80 | Temp 98.1°F | Resp 20 | Ht 62.0 in | Wt 211.0 lb

## 2014-11-09 DIAGNOSIS — N950 Postmenopausal bleeding: Secondary | ICD-10-CM

## 2014-11-09 LAB — URINALYSIS, ROUTINE W REFLEX MICROSCOPIC
Glucose, UA: NEGATIVE mg/dL
NITRITE: NEGATIVE
PH: 5 (ref 5.0–8.0)
Protein, ur: >300 mg/dL — AB
Specific Gravity, Urine: 1.015 (ref 1.005–1.030)
Urobilinogen, UA: 0.2 mg/dL (ref 0.0–1.0)

## 2014-11-09 LAB — URINALYSIS, MICROSCOPIC ONLY
Casts: NONE SEEN
Crystals: NONE SEEN

## 2014-11-09 NOTE — Progress Notes (Signed)
Subjective:    Patient ID: Diana Colon, female    DOB: 22-Feb-1929, 81 y.o.   MRN: [id number]  HPI Patient presents today complaining of intermittent painless vaginal bleeding for almost 1 year. In fact one month ago she awoke and found that she had bled in her bed. She describes the volume of blood is equal to that of a period.  She denies any pelvic pain or vaginal pain. She denies any weight loss. The bleeding comes and goes. She has not had any blood vaginal bleeding and almost 1 month. She denies any dysuria or hematuria. She is convinced that this is coming from her vagina. Past Medical History  Diagnosis Date  . Depression   . Diabetes mellitus without complication   . Hyperlipidemia   . Insomnia   . DJD (degenerative joint disease) of knee   . Hypertension   . OSA on CPAP     10 cwp   Past Surgical History  Procedure Laterality Date  . Eye surgery    . Appendectomy     Current Outpatient Prescriptions on File Prior to Visit  Medication Sig Dispense Refill  . amLODipine (NORVASC) 5 MG tablet Take 5 mg by mouth daily.    Marland Kitchen aspirin 81 MG tablet Take 81 mg by mouth daily.    . blood glucose meter kit and supplies KIT Dispense based on patient and insurance preference. Use up to four times daily as directed. (FOR ICD-9 250.00, 250.01). 1 each 0  . Coenzyme Q10 (CO Q 10 PO) Take 1 tablet by mouth daily.    Marland Kitchen CORAL CALCIUM PO Take 1 tablet by mouth daily.    . Cyanocobalamin (B-12 PO) Place 1 tablet under the tongue daily.    Marland Kitchen escitalopram (LEXAPRO) 10 MG tablet TAKE ONE TABLET BY MOUTH DAILY. 30 tablet 3  . glucose blood test strip 1 each by Other route 2 (two) times daily. Use as instructed 100 each 11  . Insulin Glargine (LANTUS SOLOSTAR) 100 UNIT/ML Solostar Pen Inject 50 Units into the skin every morning.    . Lancets 30G MISC 1 each by Does not apply route 2 (two) times daily. 100 each 11   No current facility-administered medications on file prior to visit.   Allergies   Allergen Reactions  . Ace Inhibitors   . Codeine    History   Social History  . Marital Status: Widowed    Spouse Name: N/A    Number of Children: N/A  . Years of Education: N/A   Occupational History  . Not on file.   Social History Main Topics  . Smoking status: Former Smoker    Types: Cigarettes  . Smokeless tobacco: Not on file  . Alcohol Use: No  . Drug Use: No  . Sexual Activity: Not on file   Other Topics Concern  . Not on file   Social History Narrative      Review of Systems  All other systems reviewed and are negative.      Objective:   Physical Exam  Cardiovascular: Normal rate, regular rhythm and normal heart sounds.   Pulmonary/Chest: Breath sounds normal. No respiratory distress. She has no wheezes. She has no rales.  Abdominal: Soft. Bowel sounds are normal.  Vitals reviewed.  Vaginal exam reveals intertrigo on the left labia. Speculum exam reveals no source of bleeding. Bimanual exam reveals a normal cervix and texture. Due to the patient's body habitus I cannot appreciate any ovarian or uterine masses.  Assessment & Plan:  Postmenopausal vaginal bleeding - Plan: Ambulatory referral to Obstetrics / Gynecology, Urinalysis, Routine w reflex microscopic  Urinalysis shows moderate blood. However the patient is convinced that the bleeding is coming from her vagina. Therefore I'll start with a gynecology referral for an endometrial biopsy as well as a vaginal ultrasound to rule out uterine masses and endometrial cancer. If this workup is normal I would then proceed with a cystoscopy to evaluate for bladder cancer as well as a CT scan to evaluate her kidneys.

## 2015-01-18 ENCOUNTER — Telehealth: Payer: Self-pay | Admitting: Family Medicine

## 2015-01-18 NOTE — Telephone Encounter (Signed)
Patient's daughter faith calling to see if dr pickard could send in rx for a potty chair if possible  [employer]  [phone]

## 2015-01-19 NOTE — Telephone Encounter (Signed)
Order faxed to [facility].  

## 2015-02-28 ENCOUNTER — Other Ambulatory Visit: Payer: Self-pay | Admitting: Family Medicine

## 2015-03-22 ENCOUNTER — Encounter: Payer: Self-pay | Admitting: Family Medicine

## 2015-03-22 ENCOUNTER — Ambulatory Visit (INDEPENDENT_AMBULATORY_CARE_PROVIDER_SITE_OTHER): Payer: Commercial Managed Care - HMO | Admitting: Family Medicine

## 2015-03-22 VITALS — BP 170/78 | HR 82 | Temp 98.6°F | Resp 18 | Ht 62.5 in | Wt 215.0 lb

## 2015-03-22 DIAGNOSIS — Z0000 Encounter for general adult medical examination without abnormal findings: Secondary | ICD-10-CM

## 2015-03-22 DIAGNOSIS — K219 Gastro-esophageal reflux disease without esophagitis: Secondary | ICD-10-CM

## 2015-03-22 DIAGNOSIS — E1165 Type 2 diabetes mellitus with hyperglycemia: Secondary | ICD-10-CM

## 2015-03-22 DIAGNOSIS — R053 Chronic cough: Secondary | ICD-10-CM

## 2015-03-22 DIAGNOSIS — R05 Cough: Secondary | ICD-10-CM

## 2015-03-22 DIAGNOSIS — IMO0002 Reserved for concepts with insufficient information to code with codable children: Secondary | ICD-10-CM

## 2015-03-22 MED ORDER — OMEPRAZOLE 40 MG PO CPDR: 40.0000 mg | DELAYED_RELEASE_CAPSULE | Freq: Every day | ORAL | Status: DC

## 2015-03-22 NOTE — Progress Notes (Signed)
Subjective:    Patient ID: Diana Colon, female    DOB: 1929-07-04, 81 y.o.   MRN: [id number]  HPI I saw patient in 11/2014 for post menopausal vaginal bleeding.  Patient had endometrial biopsy at [facility] in 3/16 which revealed endometrial polyps.  Patient is due for a colonoscopy as well as mammogram. However given her age she declines this at this time. Immunizations are up-to-date except for the shingles vaccine. She has had Prevnar and Pneumovax. We discussed the shingles vaccine today. She also reports several months of indigestion. She also reports a chronic cough that is nonproductive. She denies any fevers chills hemoptysis or weight loss. Her blood pressure is elevated today Past Medical History  Diagnosis Date  . Depression   . Diabetes mellitus without complication   . Hyperlipidemia   . Insomnia   . DJD (degenerative joint disease) of knee   . Hypertension   . OSA on CPAP     10 cwp   Past Surgical History  Procedure Laterality Date  . Eye surgery    . Appendectomy     Current Outpatient Prescriptions on File Prior to Visit  Medication Sig Dispense Refill  . amLODipine (NORVASC) 5 MG tablet Take 5 mg by mouth daily.    Marland Kitchen aspirin 81 MG tablet Take 81 mg by mouth daily.    . blood glucose meter kit and supplies KIT Dispense based on patient and insurance preference. Use up to four times daily as directed. (FOR ICD-9 250.00, 250.01). 1 each 0  . Coenzyme Q10 (CO Q 10 PO) Take 1 tablet by mouth daily.    Marland Kitchen CORAL CALCIUM PO Take 1 tablet by mouth daily.    . Cyanocobalamin (B-12 PO) Place 1 tablet under the tongue daily.    Marland Kitchen escitalopram (LEXAPRO) 10 MG tablet TAKE ONE TABLET BY MOUTH DAILY. 30 tablet 5  . glucose blood test strip 1 each by Other route 2 (two) times daily. Use as instructed 100 each 11  . Insulin Glargine (LANTUS SOLOSTAR) 100 UNIT/ML Solostar Pen Inject 50 Units into the skin every morning.    . Lancets 30G MISC 1 each by Does not apply route 2  (two) times daily. 100 each 11   No current facility-administered medications on file prior to visit.   Allergies  Allergen Reactions  . Ace Inhibitors   . Codeine    History   Social History  . Marital Status: Widowed    Spouse Name: N/A  . Number of Children: N/A  . Years of Education: N/A   Occupational History  . Not on file.   Social History Main Topics  . Smoking status: Former Smoker    Types: Cigarettes  . Smokeless tobacco: Not on file  . Alcohol Use: No  . Drug Use: No  . Sexual Activity: Not on file   Other Topics Concern  . Not on file   Social History Narrative   No family history on file.    Review of Systems  All other systems reviewed and are negative.      Objective:   Physical Exam  Constitutional: She is oriented to person, place, and time. She appears well-developed and well-nourished. No distress.  HENT:  Head: Normocephalic and atraumatic.  Right Ear: External ear normal.  Left Ear: External ear normal.  Nose: Nose normal.  Mouth/Throat: Oropharynx is clear and moist. No oropharyngeal exudate.  Eyes: Conjunctivae and EOM are normal. Pupils are equal, round, and reactive to  light. Right eye exhibits no discharge. Left eye exhibits no discharge. No scleral icterus.  Neck: Normal range of motion. Neck supple. No JVD present. No thyromegaly present.  Cardiovascular: Normal rate, regular rhythm, normal heart sounds and intact distal pulses.  Exam reveals no gallop and no friction rub.   No murmur heard. Pulmonary/Chest: Effort normal and breath sounds normal. No respiratory distress. She has no wheezes. She has no rales. She exhibits no tenderness.  Abdominal: Soft. Bowel sounds are normal. She exhibits no distension and no mass. There is no tenderness. There is no rebound and no guarding.  Musculoskeletal: Normal range of motion. She exhibits no edema or tenderness.  Lymphadenopathy:    She has no cervical adenopathy.  Neurological: She is  alert and oriented to person, place, and time. She has normal reflexes. She displays normal reflexes. No cranial nerve deficit. She exhibits normal muscle tone. Coordination normal.  Skin: Skin is warm. No rash noted. She is not diaphoretic. No erythema. No pallor.  Psychiatric: She has a normal mood and affect. Her behavior is normal. Judgment and thought content normal.  Vitals reviewed.         Assessment & Plan:  Gastroesophageal reflux disease without esophagitis - Plan: omeprazole (PRILOSEC) 40 MG capsule  Diabetes mellitus type II, uncontrolled - Plan: CBC with Differential/Platelet, COMPLETE METABOLIC PANEL WITH GFR, Hemoglobin A1c, Microalbumin, urine  Chronic cough - Plan: DG Chest 2 View  I believe the patient's chronic cough is likely due to esophageal reflux from her GERD. I will try treating the patient with omeprazole 40 mg by mouth daily. Recheck in one month. I will obtain a CBC, CMP, hemoglobin A1c, microalbumin. I've asked the patient to return fasting for fasting lipid panel. I will also obtain a chest x-ray.  After reviewing  the results of the blood work, I would like to start this patient on a beta blocker for hypertension. She has a history of an allergy to an ACE inhibitor. She is already on amlodipine. Therefore I will start the patient on atenolol. However if the patient's renal function can tolerate it, we may want consider starting the patient on angiotensin receptor blocker. Patient does not want mammogram. She's not need a Pap smear. Given her age I recommended against a colonoscopy.

## 2015-03-23 LAB — CBC WITH DIFFERENTIAL/PLATELET
BASOS ABS: 0.1 10*3/uL (ref 0.0–0.1)
Basophils Relative: 1 % (ref 0–1)
EOS ABS: 0.5 10*3/uL (ref 0.0–0.7)
Eosinophils Relative: 5 % (ref 0–5)
HCT: 38.8 % (ref 36.0–46.0)
HEMOGLOBIN: 12.6 g/dL (ref 12.0–15.0)
LYMPHS ABS: 1.3 10*3/uL (ref 0.7–4.0)
LYMPHS PCT: 14 % (ref 12–46)
MCH: 30 pg (ref 26.0–34.0)
MCHC: 32.5 g/dL (ref 30.0–36.0)
MCV: 92.4 fL (ref 78.0–100.0)
MONO ABS: 0.6 10*3/uL (ref 0.1–1.0)
MPV: 8.8 fL (ref 8.6–12.4)
Monocytes Relative: 7 % (ref 3–12)
Neutro Abs: 6.6 10*3/uL (ref 1.7–7.7)
Neutrophils Relative %: 73 % (ref 43–77)
PLATELETS: 298 10*3/uL (ref 150–400)
RBC: 4.2 MIL/uL (ref 3.87–5.11)
RDW: 13.1 % (ref 11.5–15.5)
WBC: 9 10*3/uL (ref 4.0–10.5)

## 2015-03-23 LAB — COMPLETE METABOLIC PANEL WITH GFR
ALBUMIN: 3.2 g/dL — AB (ref 3.5–5.2)
ALT: 10 U/L (ref 0–35)
AST: 14 U/L (ref 0–37)
Alkaline Phosphatase: 54 U/L (ref 39–117)
BUN: 20 mg/dL (ref 6–23)
CO2: 26 meq/L (ref 19–32)
CREATININE: 1.07 mg/dL (ref 0.50–1.10)
Calcium: 9.1 mg/dL (ref 8.4–10.5)
Chloride: 104 mEq/L (ref 96–112)
GFR, EST AFRICAN AMERICAN: 55 mL/min — AB
GFR, Est Non African American: 48 mL/min — ABNORMAL LOW
Glucose, Bld: 128 mg/dL — ABNORMAL HIGH (ref 70–99)
Potassium: 3.9 mEq/L (ref 3.5–5.3)
Sodium: 141 mEq/L (ref 135–145)
Total Bilirubin: 0.4 mg/dL (ref 0.2–1.2)
Total Protein: 6.2 g/dL (ref 6.0–8.3)

## 2015-03-23 LAB — MICROALBUMIN, URINE: Microalb, Ur: 139.2 mg/dL — ABNORMAL HIGH (ref <2.0)

## 2015-03-23 LAB — HEMOGLOBIN A1C
HEMOGLOBIN A1C: 8.1 % — AB (ref <5.7)
Mean Plasma Glucose: 186 mg/dL — ABNORMAL HIGH (ref <117)

## 2015-03-24 ENCOUNTER — Other Ambulatory Visit: Payer: Self-pay | Admitting: Family Medicine

## 2015-03-24 MED ORDER — METFORMIN HCL 500 MG PO TABS: ORAL_TABLET | ORAL | Status: DC

## 2015-03-24 MED ORDER — VALSARTAN 320 MG PO TABS: 320.0000 mg | ORAL_TABLET | Freq: Every day | ORAL | Status: DC

## 2015-03-31 ENCOUNTER — Encounter: Payer: Self-pay | Admitting: *Deleted

## 2015-04-26 ENCOUNTER — Ambulatory Visit: Payer: Commercial Managed Care - HMO | Admitting: Family Medicine

## 2015-04-28 ENCOUNTER — Telehealth: Payer: Self-pay | Admitting: Family Medicine

## 2015-04-28 NOTE — Telephone Encounter (Signed)
Pharmacy has sent two requests  1.  Does pt need statin therapy??  Has not refilled Pravastatin since 2014?  Please advise?  2.  Pt told them she is not taking her Metformin due to significant diarrhea.  PhD counseled her on med timing and taking with meals however pt requested they contact us about alternative medications

## 2015-04-29 MED ORDER — SITAGLIPTIN PHOSPHATE 100 MG PO TABS: 100.0000 mg | ORAL_TABLET | Freq: Every day | ORAL | Status: DC

## 2015-04-29 NOTE — Telephone Encounter (Signed)
I recommend dc metformin and start januvia 100 mg poqday.  I cannot answer question regarding pravastatin.  Due to non compliance, she has not checked a FLP in years.  Most likely she needs pravastatin but I would like to get a FLP.

## 2015-04-29 NOTE — Telephone Encounter (Signed)
Pharmacy called and given order for Januvia.  Metformin discontinued.  Trying to call pt to see if we can get her to come to office for FLP.

## 2015-05-03 ENCOUNTER — Telehealth: Payer: Self-pay | Admitting: *Deleted

## 2015-05-03 NOTE — Telephone Encounter (Signed)
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 Deloria Lair is aware of this authorization

## 2015-05-04 ENCOUNTER — Encounter: Payer: Self-pay | Admitting: Family Medicine

## 2015-05-04 NOTE — Telephone Encounter (Signed)
Tried to call patient.  Someone answered phone.  Very weak response, then line went blank.  Could hear background noises but no one answering.  Concerned for elderly patient.  Then called daughter, she said she talked to mother yesterday and before I could relay my concerns that phone call also dropped??  Called daughter back,  Relayed concern for mother based on no response after answering phone.  She says mother has been under extreme stress lately but is going to go over there now and check on her.

## 2015-05-10 ENCOUNTER — Telehealth: Payer: Self-pay | Admitting: *Deleted

## 2015-05-10 NOTE — Telephone Encounter (Signed)
Submitted humana referral thru acuity connect for authorization on 05/07/15 to Dr. 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

## 2015-05-14 ENCOUNTER — Other Ambulatory Visit: Payer: Self-pay | Admitting: Family Medicine

## 2015-07-15 ENCOUNTER — Ambulatory Visit: Payer: Commercial Managed Care - HMO

## 2015-07-22 ENCOUNTER — Ambulatory Visit: Payer: Commercial Managed Care - HMO

## 2015-09-17 ENCOUNTER — Other Ambulatory Visit: Payer: Self-pay | Admitting: Family Medicine

## 2015-09-17 ENCOUNTER — Encounter: Payer: Self-pay | Admitting: Family Medicine

## 2015-09-17 NOTE — Telephone Encounter (Signed)
Medication refill for one time only.  Patient needs to be seen.  Letter sent for patient to call and schedule 

## 2015-10-29 ENCOUNTER — Other Ambulatory Visit: Payer: Self-pay | Admitting: Family Medicine

## 2015-12-02 ENCOUNTER — Telehealth: Payer: Self-pay | Admitting: *Deleted

## 2015-12-02 NOTE — Telephone Encounter (Signed)
Received fax from [facility] care mgmt with authorization to Dr. Deloria Lair with authorization [id number]  Requesting provider: Dominica Severin Rankin,MD  Treating provider: Dominica Severin Rankin,MD  Number of visits:6  Start date: 12/01/15  End date: 05/29/16  Dx: [id number] Type 2 diab with severe nonp rtnop without macular edema,bi  PCP: Flonnie Hailstone

## 2015-12-13 ENCOUNTER — Encounter: Payer: Self-pay | Admitting: Family Medicine

## 2015-12-24 ENCOUNTER — Encounter: Payer: Self-pay | Admitting: Family Medicine

## 2015-12-24 ENCOUNTER — Ambulatory Visit (INDEPENDENT_AMBULATORY_CARE_PROVIDER_SITE_OTHER): Payer: Commercial Managed Care - HMO | Admitting: Family Medicine

## 2015-12-24 VITALS — BP 150/68 | HR 98 | Temp 98.6°F | Resp 22 | Ht 62.5 in | Wt 210.0 lb

## 2015-12-24 DIAGNOSIS — Z794 Long term (current) use of insulin: Secondary | ICD-10-CM | POA: Diagnosis not present

## 2015-12-24 DIAGNOSIS — R011 Cardiac murmur, unspecified: Secondary | ICD-10-CM | POA: Diagnosis not present

## 2015-12-24 DIAGNOSIS — E1100 Type 2 diabetes mellitus with hyperosmolarity without nonketotic hyperglycemic-hyperosmolar coma (NKHHC): Secondary | ICD-10-CM | POA: Diagnosis not present

## 2015-12-24 NOTE — Progress Notes (Signed)
Subjective:    Patient ID: Diana Colon, female    DOB: [birth year], 82 y.o.   MRN: [id number]  HPI 6/16 I saw patient in 11/2014 for post menopausal vaginal bleeding.  Patient had endometrial biopsy at [facility] in 3/16 which revealed endometrial polyps.  Patient is due for a colonoscopy as well as mammogram. However given her age she declines this at this time. Immunizations are up-to-date except for the shingles vaccine. She has had Prevnar and Pneumovax. We discussed the shingles vaccine today. She also reports several months of indigestion. She also reports a chronic cough that is nonproductive. She denies any fevers chills hemoptysis or weight loss. Her blood pressure is elevated today.  At that time, my plan was:  believe the patient's chronic cough is likely due to esophageal reflux from her GERD. I will try treating the patient with omeprazole 40 mg by mouth daily. Recheck in one month. I will obtain a CBC, CMP, hemoglobin A1c, microalbumin. I've asked the patient to return fasting for fasting lipid panel. I will also obtain a chest x-ray.  After reviewing  the results of the blood work, I would like to start this patient on a beta blocker for hypertension. She has a history of an allergy to an ACE inhibitor. She is already on amlodipine. Therefore I will start the patient on atenolol. However if the patient's renal function can tolerate it, we may want consider starting the patient on angiotensin receptor blocker. Patient does not want mammogram. She's not need a Pap smear. Given her age I recommended against a colonoscopy.  12/24/15 A1c was 8.1 in June. I recommended starting metformin 1000 mg by mouth twice a day and adding Diovan 320 mg a day for hypertension. I wanted to see her back in one month but she never returned.  My nurse reviewed her medication list with the patient but I have no idea what she is actually taking. She does not appear to be on metformin and I doubt that she  truly have restarted the valsartan. I'm also not sure if she is on amlodipine. She states that her fasting blood sugars are around 130 and her 2 hour postprandial sugars are around 180 but I question how often she is really checking her sugars. She denies any chest pain or shortness of breath or dyspnea on exertion. Her cardiac murmur sounds much worse Past Medical History  Diagnosis Date  . Depression   . Diabetes mellitus without complication ([place])   . Hyperlipidemia   . Insomnia   . DJD (degenerative joint disease) of knee   . Hypertension   . OSA on CPAP     10 cwp  . Diabetic retinopathy [facility])    Past Surgical History  Procedure Laterality Date  . Eye surgery    . Appendectomy     Current Outpatient Prescriptions on File Prior to Visit  Medication Sig Dispense Refill  . amLODipine (NORVASC) 5 MG tablet Take 5 mg by mouth daily.    Marland Kitchen aspirin 81 MG tablet Take 81 mg by mouth daily.    . blood glucose meter kit and supplies KIT Dispense based on patient and insurance preference. Use up to four times daily as directed. (FOR ICD-9 250.00, 250.01). 1 each 0  . Coenzyme Q10 (CO Q 10 PO) Take 1 tablet by mouth daily.    Marland Kitchen CORAL CALCIUM PO Take 1 tablet by mouth daily.    . Cyanocobalamin (B-12 PO) Place 1 tablet under the  tongue daily.    Marland Kitchen escitalopram (LEXAPRO) 10 MG tablet TAKE ONE TABLET BY MOUTH DAILY. 30 tablet 5  . glucose blood test strip 1 each by Other route 2 (two) times daily. Use as instructed 100 each 11  . Lancets 30G MISC 1 each by Does not apply route 2 (two) times daily. 100 each 11  . LANTUS SOLOSTAR 100 UNIT/ML Solostar Pen INJECT 50 UNITS SUBCUTANEOUSLY AT BEDTIME. 45 mL 0  . omeprazole (PRILOSEC) 40 MG capsule Take 1 capsule (40 mg total) by mouth daily. 30 capsule 11  . sitaGLIPtin (JANUVIA) 100 MG tablet Take 1 tablet (100 mg total) by mouth daily. 30 tablet 2  . valsartan (DIOVAN) 320 MG tablet Take 1 tablet (320 mg total) by mouth daily. 30 tablet 5   No  current facility-administered medications on file prior to visit.   Allergies  Allergen Reactions  . Ace Inhibitors   . Codeine   . Metformin And Related Diarrhea   Social History   Social History  . Marital Status: Widowed    Spouse Name: N/A  . Number of Children: N/A  . Years of Education: N/A   Occupational History  . Not on file.   Social History Main Topics  . Smoking status: Former Smoker    Types: Cigarettes  . Smokeless tobacco: Not on file  . Alcohol Use: No  . Drug Use: No  . Sexual Activity: Not on file   Other Topics Concern  . Not on file   Social History Narrative   No family history on file.    Review of Systems  All other systems reviewed and are negative.      Objective:   Physical Exam  Constitutional: She is oriented to person, place, and time. She appears well-developed and well-nourished. No distress.  HENT:  Head: Normocephalic and atraumatic.  Right Ear: External ear normal.  Left Ear: External ear normal.  Nose: Nose normal.  Mouth/Throat: Oropharynx is clear and moist. No oropharyngeal exudate.  Eyes: Conjunctivae and EOM are normal. Pupils are equal, round, and reactive to light. Right eye exhibits no discharge. Left eye exhibits no discharge. No scleral icterus.  Neck: Normal range of motion. Neck supple. No JVD present. No thyromegaly present.  Cardiovascular: Normal rate, regular rhythm and intact distal pulses.  Exam reveals no gallop and no friction rub.   Murmur heard. Pulmonary/Chest: Effort normal and breath sounds normal. No respiratory distress. She has no wheezes. She has no rales. She exhibits no tenderness.  Abdominal: Soft. Bowel sounds are normal. She exhibits no distension and no mass. There is no tenderness. There is no rebound and no guarding.  Musculoskeletal: Normal range of motion. She exhibits no edema or tenderness.  Lymphadenopathy:    She has no cervical adenopathy.  Neurological: She is alert and oriented  to person, place, and time. She has normal reflexes. No cranial nerve deficit. She exhibits normal muscle tone. Coordination normal.  Skin: Skin is warm. No rash noted. She is not diaphoretic. No erythema. No pallor.  Psychiatric: She has a normal mood and affect. Her behavior is normal. Judgment and thought content normal.  Vitals reviewed.         Assessment & Plan:  Uncontrolled type 2 diabetes mellitus with hyperosmolarity without coma, with long-term current use of insulin (HCC) - Plan: CBC with Differential/Platelet, COMPLETE METABOLIC PANEL WITH GFR, Microalbumin, urine, Hemoglobin A1c  Check hemoglobin A1c. Goal hemoglobin A1c is between 7 and 8 for this patient.  I am concerned that her murmur sounds much worse and I will schedule her for an echocardiogram. She had aortic sclerosis 2 years ago on echocardiogram. Blood pressures elevated. The patient is to go home and call me with the medicine she is taking so that I can make changes. Her goal blood pressure will be less than 140/90.

## 2015-12-25 LAB — COMPLETE METABOLIC PANEL WITH GFR
ALT: 10 U/L (ref 6–29)
AST: 14 U/L (ref 10–35)
Albumin: 3.1 g/dL — ABNORMAL LOW (ref 3.6–5.1)
Alkaline Phosphatase: 51 U/L (ref 33–130)
BUN: 28 mg/dL — ABNORMAL HIGH (ref 7–25)
CHLORIDE: 105 mmol/L (ref 98–110)
CO2: 24 mmol/L (ref 20–31)
Calcium: 9 mg/dL (ref 8.6–10.4)
Creat: 1.27 mg/dL — ABNORMAL HIGH (ref 0.60–0.88)
GFR, Est African American: 44 mL/min — ABNORMAL LOW (ref >=60)
GFR, Est Non African American: 39 mL/min — ABNORMAL LOW (ref >=60)
GLUCOSE: 142 mg/dL — AB (ref 70–99)
POTASSIUM: 4 mmol/L (ref 3.5–5.3)
SODIUM: 141 mmol/L (ref 135–146)
Total Bilirubin: 0.4 mg/dL (ref 0.2–1.2)
Total Protein: 6 g/dL — ABNORMAL LOW (ref 6.1–8.1)

## 2015-12-25 LAB — CBC WITH DIFFERENTIAL/PLATELET
BASOS PCT: 1 % (ref 0–1)
Basophils Absolute: 0.1 10*3/uL (ref 0.0–0.1)
Eosinophils Absolute: 0.5 10*3/uL (ref 0.0–0.7)
Eosinophils Relative: 5 % (ref 0–5)
HCT: 40.4 % (ref 36.0–46.0)
Hemoglobin: 13 g/dL (ref 12.0–15.0)
LYMPHS PCT: 14 % (ref 12–46)
Lymphs Abs: 1.3 10*3/uL (ref 0.7–4.0)
MCH: 30 pg (ref 26.0–34.0)
MCHC: 32.2 g/dL (ref 30.0–36.0)
MCV: 93.3 fL (ref 78.0–100.0)
MONO ABS: 0.5 10*3/uL (ref 0.1–1.0)
MONOS PCT: 5 % (ref 3–12)
MPV: 8.7 fL (ref 8.6–12.4)
NEUTROS ABS: 6.8 10*3/uL (ref 1.7–7.7)
NEUTROS PCT: 75 % (ref 43–77)
PLATELETS: 323 10*3/uL (ref 150–400)
RBC: 4.33 MIL/uL (ref 3.87–5.11)
RDW: 12.5 % (ref 11.5–15.5)
WBC: 9.1 10*3/uL (ref 4.0–10.5)

## 2015-12-25 LAB — MICROALBUMIN, URINE: Microalb, Ur: 380.7 mg/dL

## 2015-12-25 LAB — HEMOGLOBIN A1C
Hgb A1c MFr Bld: 7.5 % — ABNORMAL HIGH (ref <5.7)
MEAN PLASMA GLUCOSE: 169 mg/dL — AB (ref <117)

## 2015-12-27 ENCOUNTER — Encounter: Payer: Self-pay | Admitting: *Deleted

## 2016-01-03 ENCOUNTER — Telehealth: Payer: Self-pay | Admitting: *Deleted

## 2016-01-03 NOTE — Telephone Encounter (Signed)
Submitted humana referral thru acuity connect for authorization on 12/31/15 for ECHO to [facility] with authorization [id number]  Requesting provider: Flonnie Hailstone  Treating provider: [facility]  Place of service: Physicians office  Number of visits:1  Start Date:01/12/16  End Date: 07/10/16  Dx: R01.1- Cardiac Murmur,Unspecified  Type of service: CARD  Procedures: [id number] F-Up or Lmtd

## 2016-01-10 ENCOUNTER — Other Ambulatory Visit: Payer: Self-pay | Admitting: Family Medicine

## 2016-01-10 MED ORDER — VALSARTAN 320 MG PO TABS: 320.0000 mg | ORAL_TABLET | Freq: Every day | ORAL | Status: AC

## 2016-01-10 NOTE — Telephone Encounter (Signed)
Medication called/sent to requested pharmacy and to contact us.

## 2016-01-12 ENCOUNTER — Other Ambulatory Visit: Payer: Self-pay | Admitting: Family Medicine

## 2016-01-12 ENCOUNTER — Other Ambulatory Visit: Payer: Medicare Other

## 2016-01-12 ENCOUNTER — Encounter: Payer: Self-pay | Admitting: Family Medicine

## 2016-01-12 MED ORDER — INSULIN GLARGINE 100 UNIT/ML SOLOSTAR PEN: PEN_INJECTOR | SUBCUTANEOUS | Status: DC

## 2016-03-23 ENCOUNTER — Emergency Department (HOSPITAL_COMMUNITY): Payer: Commercial Managed Care - HMO

## 2016-03-23 ENCOUNTER — Encounter (HOSPITAL_COMMUNITY): Payer: Self-pay

## 2016-03-23 ENCOUNTER — Other Ambulatory Visit: Payer: Self-pay

## 2016-03-23 ENCOUNTER — Inpatient Hospital Stay (HOSPITAL_COMMUNITY)
Admission: EM | Admit: 2016-03-23 | Discharge: 2016-03-28 | DRG: 637 | Disposition: A | Discharge status: Skilled Nursing Facility | Payer: Commercial Managed Care - HMO | Attending: Internal Medicine | Admitting: Internal Medicine

## 2016-03-23 DIAGNOSIS — M6282 Rhabdomyolysis: Secondary | ICD-10-CM | POA: Diagnosis not present

## 2016-03-23 DIAGNOSIS — L03317 Cellulitis of buttock: Secondary | ICD-10-CM | POA: Diagnosis present

## 2016-03-23 DIAGNOSIS — F329 Major depressive disorder, single episode, unspecified: Secondary | ICD-10-CM

## 2016-03-23 DIAGNOSIS — IMO0002 Reserved for concepts with insufficient information to code with codable children: Secondary | ICD-10-CM

## 2016-03-23 DIAGNOSIS — E1365 Other specified diabetes mellitus with hyperglycemia: Secondary | ICD-10-CM

## 2016-03-23 DIAGNOSIS — Z7984 Long term (current) use of oral hypoglycemic drugs: Secondary | ICD-10-CM | POA: Diagnosis not present

## 2016-03-23 DIAGNOSIS — R945 Abnormal results of liver function studies: Secondary | ICD-10-CM

## 2016-03-23 DIAGNOSIS — R21 Rash and other nonspecific skin eruption: Secondary | ICD-10-CM | POA: Diagnosis present

## 2016-03-23 DIAGNOSIS — E669 Obesity, unspecified: Secondary | ICD-10-CM | POA: Diagnosis present

## 2016-03-23 DIAGNOSIS — E871 Hypo-osmolality and hyponatremia: Secondary | ICD-10-CM | POA: Diagnosis present

## 2016-03-23 DIAGNOSIS — F32A Depression, unspecified: Secondary | ICD-10-CM

## 2016-03-23 DIAGNOSIS — E1122 Type 2 diabetes mellitus with diabetic chronic kidney disease: Secondary | ICD-10-CM | POA: Diagnosis present

## 2016-03-23 DIAGNOSIS — M179 Osteoarthritis of knee, unspecified: Secondary | ICD-10-CM | POA: Diagnosis present

## 2016-03-23 DIAGNOSIS — Z7982 Long term (current) use of aspirin: Secondary | ICD-10-CM

## 2016-03-23 DIAGNOSIS — Z794 Long term (current) use of insulin: Secondary | ICD-10-CM | POA: Diagnosis not present

## 2016-03-23 DIAGNOSIS — D509 Iron deficiency anemia, unspecified: Secondary | ICD-10-CM | POA: Diagnosis present

## 2016-03-23 DIAGNOSIS — N170 Acute kidney failure with tubular necrosis: Secondary | ICD-10-CM | POA: Diagnosis present

## 2016-03-23 DIAGNOSIS — I9589 Other hypotension: Secondary | ICD-10-CM | POA: Diagnosis present

## 2016-03-23 DIAGNOSIS — G9340 Encephalopathy, unspecified: Secondary | ICD-10-CM | POA: Diagnosis not present

## 2016-03-23 DIAGNOSIS — Z87891 Personal history of nicotine dependence: Secondary | ICD-10-CM

## 2016-03-23 DIAGNOSIS — I959 Hypotension, unspecified: Secondary | ICD-10-CM | POA: Diagnosis present

## 2016-03-23 DIAGNOSIS — E860 Dehydration: Secondary | ICD-10-CM

## 2016-03-23 DIAGNOSIS — G9341 Metabolic encephalopathy: Secondary | ICD-10-CM | POA: Diagnosis present

## 2016-03-23 DIAGNOSIS — E1329 Other specified diabetes mellitus with other diabetic kidney complication: Secondary | ICD-10-CM | POA: Diagnosis not present

## 2016-03-23 DIAGNOSIS — N183 Chronic kidney disease, stage 3 (moderate): Secondary | ICD-10-CM | POA: Diagnosis present

## 2016-03-23 DIAGNOSIS — E861 Hypovolemia: Secondary | ICD-10-CM | POA: Diagnosis present

## 2016-03-23 DIAGNOSIS — E1165 Type 2 diabetes mellitus with hyperglycemia: Secondary | ICD-10-CM | POA: Diagnosis present

## 2016-03-23 DIAGNOSIS — E785 Hyperlipidemia, unspecified: Secondary | ICD-10-CM | POA: Diagnosis present

## 2016-03-23 DIAGNOSIS — B3789 Other sites of candidiasis: Secondary | ICD-10-CM | POA: Diagnosis present

## 2016-03-23 DIAGNOSIS — N289 Disorder of kidney and ureter, unspecified: Secondary | ICD-10-CM

## 2016-03-23 DIAGNOSIS — G4733 Obstructive sleep apnea (adult) (pediatric): Secondary | ICD-10-CM | POA: Diagnosis present

## 2016-03-23 DIAGNOSIS — R7989 Other specified abnormal findings of blood chemistry: Secondary | ICD-10-CM | POA: Diagnosis not present

## 2016-03-23 DIAGNOSIS — N19 Unspecified kidney failure: Secondary | ICD-10-CM

## 2016-03-23 DIAGNOSIS — E119 Type 2 diabetes mellitus without complications: Secondary | ICD-10-CM

## 2016-03-23 DIAGNOSIS — B379 Candidiasis, unspecified: Secondary | ICD-10-CM

## 2016-03-23 DIAGNOSIS — Z79899 Other long term (current) drug therapy: Secondary | ICD-10-CM

## 2016-03-23 DIAGNOSIS — Z9119 Patient's noncompliance with other medical treatment and regimen: Secondary | ICD-10-CM

## 2016-03-23 DIAGNOSIS — G4700 Insomnia, unspecified: Secondary | ICD-10-CM

## 2016-03-23 DIAGNOSIS — B372 Candidiasis of skin and nail: Secondary | ICD-10-CM | POA: Diagnosis present

## 2016-03-23 DIAGNOSIS — N179 Acute kidney failure, unspecified: Secondary | ICD-10-CM | POA: Diagnosis present

## 2016-03-23 DIAGNOSIS — R739 Hyperglycemia, unspecified: Secondary | ICD-10-CM

## 2016-03-23 DIAGNOSIS — D649 Anemia, unspecified: Secondary | ICD-10-CM

## 2016-03-23 DIAGNOSIS — Z9989 Dependence on other enabling machines and devices: Secondary | ICD-10-CM

## 2016-03-23 DIAGNOSIS — N390 Urinary tract infection, site not specified: Secondary | ICD-10-CM | POA: Diagnosis present

## 2016-03-23 DIAGNOSIS — E869 Volume depletion, unspecified: Secondary | ICD-10-CM

## 2016-03-23 DIAGNOSIS — W19XXXA Unspecified fall, initial encounter: Secondary | ICD-10-CM

## 2016-03-23 DIAGNOSIS — Y92009 Unspecified place in unspecified non-institutional (private) residence as the place of occurrence of the external cause: Secondary | ICD-10-CM

## 2016-03-23 DIAGNOSIS — I129 Hypertensive chronic kidney disease with stage 1 through stage 4 chronic kidney disease, or unspecified chronic kidney disease: Secondary | ICD-10-CM | POA: Diagnosis present

## 2016-03-23 LAB — COMPREHENSIVE METABOLIC PANEL
ALBUMIN: 2.8 g/dL — AB (ref 3.5–5.0)
ALK PHOS: 65 U/L (ref 38–126)
ALT: 81 U/L — AB (ref 14–54)
ANION GAP: 13 (ref 5–15)
AST: 151 U/L — ABNORMAL HIGH (ref 15–41)
BILIRUBIN TOTAL: 1.6 mg/dL — AB (ref 0.3–1.2)
BUN: 63 mg/dL — AB (ref 6–20)
CALCIUM: 8.5 mg/dL — AB (ref 8.9–10.3)
CO2: 21 mmol/L — AB (ref 22–32)
CREATININE: 2.09 mg/dL — AB (ref 0.44–1.00)
Chloride: 95 mmol/L — ABNORMAL LOW (ref 101–111)
GFR calc Af Amer: 24 mL/min — ABNORMAL LOW (ref >60)
GFR calc non Af Amer: 20 mL/min — ABNORMAL LOW (ref >60)
GLUCOSE: 609 mg/dL — AB (ref 65–99)
Potassium: 4.9 mmol/L (ref 3.5–5.1)
SODIUM: 129 mmol/L — AB (ref 135–145)
TOTAL PROTEIN: 6.2 g/dL — AB (ref 6.5–8.1)

## 2016-03-23 LAB — URINE MICROSCOPIC-ADD ON

## 2016-03-23 LAB — DIFFERENTIAL
Basophils Absolute: 0 10*3/uL (ref 0.0–0.1)
Basophils Relative: 0 %
EOS PCT: 0 %
Eosinophils Absolute: 0 10*3/uL (ref 0.0–0.7)
LYMPHS ABS: 1.1 10*3/uL (ref 0.7–4.0)
LYMPHS PCT: 6 %
Monocytes Absolute: 0.2 10*3/uL (ref 0.1–1.0)
Monocytes Relative: 1 %
NEUTROS ABS: 16.2 10*3/uL — AB (ref 1.7–7.7)
NEUTROS PCT: 93 %

## 2016-03-23 LAB — APTT: aPTT: 23 seconds — ABNORMAL LOW (ref 24–37)

## 2016-03-23 LAB — CBC
HCT: 33 % — ABNORMAL LOW (ref 36.0–46.0)
HEMOGLOBIN: 11.3 g/dL — AB (ref 12.0–15.0)
MCH: 30.7 pg (ref 26.0–34.0)
MCHC: 34.2 g/dL (ref 30.0–36.0)
MCV: 89.7 fL (ref 78.0–100.0)
PLATELETS: 386 10*3/uL (ref 150–400)
RBC: 3.68 MIL/uL — AB (ref 3.87–5.11)
RDW: 12.2 % (ref 11.5–15.5)
WBC: 17.5 10*3/uL — AB (ref 4.0–10.5)

## 2016-03-23 LAB — GLUCOSE, CAPILLARY
GLUCOSE-CAPILLARY: 195 mg/dL — AB (ref 65–99)
Glucose-Capillary: 280 mg/dL — ABNORMAL HIGH (ref 65–99)

## 2016-03-23 LAB — CK: Total CK: 6126 U/L — ABNORMAL HIGH (ref 38–234)

## 2016-03-23 LAB — URINALYSIS, ROUTINE W REFLEX MICROSCOPIC
Bilirubin Urine: NEGATIVE
Glucose, UA: >1000 mg/dL — AB
Ketones, ur: 15 mg/dL — AB
LEUKOCYTES UA: NEGATIVE
NITRITE: NEGATIVE
PROTEIN: 100 mg/dL — AB
SPECIFIC GRAVITY, URINE: 1.02 (ref 1.005–1.030)
pH: 5.5 (ref 5.0–8.0)

## 2016-03-23 LAB — RAPID URINE DRUG SCREEN, HOSP PERFORMED
Amphetamines: NOT DETECTED
BARBITURATES: NOT DETECTED
Benzodiazepines: NOT DETECTED
Cocaine: NOT DETECTED
Opiates: NOT DETECTED
Tetrahydrocannabinol: NOT DETECTED

## 2016-03-23 LAB — CBG MONITORING, ED
GLUCOSE-CAPILLARY: 569 mg/dL — AB (ref 65–99)
GLUCOSE-CAPILLARY: 467 mg/dL — AB (ref 65–99)
GLUCOSE-CAPILLARY: 413 mg/dL — AB (ref 65–99)
Glucose-Capillary: 597 mg/dL (ref 65–99)
Glucose-Capillary: 503 mg/dL — ABNORMAL HIGH (ref 65–99)

## 2016-03-23 LAB — ETHANOL: Alcohol, Ethyl (B): <5 mg/dL (ref <5)

## 2016-03-23 MED ORDER — SODIUM BICARBONATE 8.4 % IV SOLN
INTRAVENOUS | Status: AC
  Filled 2016-03-23: qty 150

## 2016-03-23 MED ORDER — NYSTATIN 100000 UNIT/GM EX CREA
1.0000 "application " | TOPICAL_CREAM | Freq: Two times a day (BID) | CUTANEOUS | Status: DC
  Administered 2016-03-24 – 2016-03-28 (×9): 1 via TOPICAL
  Filled 2016-03-23: qty 15

## 2016-03-23 MED ORDER — DEXTROSE 5 % IV SOLN
2.0000 g | INTRAVENOUS | Status: DC
  Administered 2016-03-24 – 2016-03-27 (×4): 2 g via INTRAVENOUS
  Filled 2016-03-23 (×5): qty 2

## 2016-03-23 MED ORDER — SODIUM CHLORIDE 0.9 % IV BOLUS (SEPSIS)
2000.0000 mL | Freq: Once | INTRAVENOUS | Status: AC
Start: 2016-03-23 — End: 2016-03-23
  Administered 2016-03-23: 2000 mL via INTRAVENOUS

## 2016-03-23 MED ORDER — BARRIER CREAM NON-SPECIFIED
1.0000 "application " | TOPICAL_CREAM | Freq: Two times a day (BID) | TOPICAL | Status: DC
  Administered 2016-03-24 – 2016-03-28 (×8): 1 via TOPICAL
  Filled 2016-03-23: qty 1

## 2016-03-23 MED ORDER — SODIUM CHLORIDE 0.9 % IV SOLN
INTRAVENOUS | Status: DC | PRN
  Filled 2016-03-23: qty 2.5

## 2016-03-23 MED ORDER — INSULIN REGULAR HUMAN 100 UNIT/ML IJ SOLN
INTRAMUSCULAR | Status: AC
  Filled 2016-03-23: qty 2.5

## 2016-03-23 MED ORDER — INSULIN ASPART 100 UNIT/ML ~~LOC~~ SOLN
0.0000 [IU] | SUBCUTANEOUS | Status: DC
Start: 2016-03-23 — End: 2016-03-23

## 2016-03-23 MED ORDER — INSULIN REGULAR HUMAN 100 UNIT/ML IJ SOLN
INTRAMUSCULAR | Status: DC
  Administered 2016-03-23: 5.4 [IU]/h via INTRAVENOUS
  Filled 2016-03-23: qty 2.5

## 2016-03-23 MED ORDER — INSULIN REGULAR BOLUS VIA INFUSION
0.0000 [IU] | Freq: Three times a day (TID) | INTRAVENOUS | Status: DC | PRN
Start: 2016-03-23 — End: 2016-03-23
  Filled 2016-03-23: qty 10

## 2016-03-23 MED ORDER — STERILE WATER FOR INJECTION IV SOLN
INTRAVENOUS | Status: DC
  Administered 2016-03-23 – 2016-03-24 (×2): via INTRAVENOUS
  Filled 2016-03-23 (×8): qty 850

## 2016-03-23 MED ORDER — ENOXAPARIN SODIUM 30 MG/0.3ML ~~LOC~~ SOLN
30.0000 mg | SUBCUTANEOUS | Status: DC
  Administered 2016-03-23 – 2016-03-25 (×3): 30 mg via SUBCUTANEOUS
  Filled 2016-03-23 (×3): qty 0.3

## 2016-03-23 MED ORDER — NYSTATIN 100000 UNIT/GM EX POWD
Freq: Two times a day (BID) | CUTANEOUS | Status: DC
  Administered 2016-03-23 – 2016-03-28 (×10): via TOPICAL
  Filled 2016-03-23: qty 15

## 2016-03-23 MED ORDER — DEXTROSE 5 % IV SOLN
1.0000 g | Freq: Once | INTRAVENOUS | Status: AC
  Administered 2016-03-23: 1 g via INTRAVENOUS
  Filled 2016-03-23: qty 10

## 2016-03-23 NOTE — ED Notes (Signed)
Pt received 1000 ml of NS started by EMS

## 2016-03-23 NOTE — ED Notes (Addendum)
Daughter at bedside. Reports that pt does not let her in her house. Reports she had 2 falls, most recent  fall last sunday  States she has not walked since last Thursday.

## 2016-03-23 NOTE — ED Notes (Signed)
Pt brought in by EMS. Adult protective services were called to check on pt. Pt CBG 590 per EMS. Pt unable to ambulate and get insulin. Covered with stool and urine. Pt states legs give away when she tries to walk. Complains of pain left foot

## 2016-03-23 NOTE — ED Notes (Signed)
Vaginal and peri area extremely swollen, red and excoriated. Yellow vaginal discharge, foul smelling. Right ischial area noted to have moist greenish/black area. Dr Tomi Bamberger in to see. Peri care provided and moisture barrier cream applied

## 2016-03-23 NOTE — ED Provider Notes (Signed)
CSN: [id number]     Arrival date & time 03/23/16  1519 History   First MD Initiated Contact with Patient 03/23/16 1535     Chief Complaint  Patient presents with  . Hyperglycemia   HPI Pt had a home visit by adult protective services.  They found the patient covered in stool and urine.  Her blood sugar was checked and it was 590.    Family states that the patient was out walking on Thursday. She fell. She was helped back to her home. Patient has not been able to walk since Thursday. She has not been able to make it to the bathroom. The patient's daughter has been checking on her and bringing her food. The daughter is not strong enough to take the patient up and the patient has refused any medical attention. Today she finally agreed to come to the emergency room. Patient has not been taking her medications. She complains of pain in both knees. Her legs feel weak. She also has pain in her foot. She has noticed bruising in her left thigh left knee and left foot after the fall the other day. She has a headache. She does not think she lost consciousness when she fell the other day. She denies any neck pain or back pain. No chest pain or abdominal pain Past Medical History  Diagnosis Date  . Depression   . Diabetes mellitus without complication ([place])   . Hyperlipidemia   . Insomnia   . DJD (degenerative joint disease) of knee   . Hypertension   . OSA on CPAP     10 cwp  . Diabetic retinopathy [facility])    Past Surgical History  Procedure Laterality Date  . Eye surgery    . Appendectomy     No family history on file. Social History  Substance Use Topics  . Smoking status: Former Smoker    Types: Cigarettes  . Smokeless tobacco: None  . Alcohol Use: No   OB History    No data available     Review of Systems  All other systems reviewed and are negative.     Allergies  Ace inhibitors; Codeine; and Metformin and related  Home Medications   Prior to Admission medications    Medication Sig Start Date End Date Taking? Authorizing Provider  amLODipine (NORVASC) 5 MG tablet Take 5 mg by mouth daily.    Historical Provider, MD  aspirin 81 MG tablet Take 81 mg by mouth daily.    Historical Provider, MD  blood glucose meter kit and supplies KIT Dispense based on patient and insurance preference. Use up to four times daily as directed. (FOR ICD-9 250.00, 250.01). 10/27/14   Susy Frizzle, MD  Coenzyme Q10 (CO Q 10 PO) Take 1 tablet by mouth daily.    Historical Provider, MD  CORAL CALCIUM PO Take 1 tablet by mouth daily.    Historical Provider, MD  Cyanocobalamin (B-12 PO) Place 1 tablet under the tongue daily.    Historical Provider, MD  escitalopram (LEXAPRO) 10 MG tablet TAKE ONE TABLET BY MOUTH DAILY. 10/29/15   Susy Frizzle, MD  glucose blood test strip 1 each by Other route 2 (two) times daily. Use as instructed 10/27/14   Susy Frizzle, MD  Insulin Glargine (LANTUS SOLOSTAR) 100 UNIT/ML Solostar Pen INJECT 52 UNITS SUBCUTANEOUSLY AT BEDTIME. Patient taking differently: Inject 50 Units into the skin daily.  01/12/16   Susy Frizzle, MD  Lancets 30G MISC 1 each by  Does not apply route 2 (two) times daily. 10/27/14   Susy Frizzle, MD  omeprazole (PRILOSEC) 40 MG capsule Take 1 capsule (40 mg total) by mouth daily. 03/22/15   Susy Frizzle, MD  sitaGLIPtin (JANUVIA) 100 MG tablet Take 1 tablet (100 mg total) by mouth daily. 04/29/15   Susy Frizzle, MD  valsartan (DIOVAN) 320 MG tablet Take 1 tablet (320 mg total) by mouth daily. 01/10/16   Susy Frizzle, MD   BP 165/66 mmHg  Pulse 81  Temp(Src) 98.1 F (36.7 C) (Oral)  Resp 18  Ht 5' 2.5" (1.588 m)  Wt 99.791 kg  BMI 39.57 kg/m2  SpO2 98% Physical Exam  Constitutional: No distress.  Malodorous, unkempt  HENT:  Head: Normocephalic and atraumatic.  Right Ear: External ear normal.  Left Ear: External ear normal.  Mouth/Throat: No oropharyngeal exudate.  Mucous membranes are dry  Eyes:  Conjunctivae are normal. Right eye exhibits no discharge. Left eye exhibits no discharge. No scleral icterus.  Neck: Neck supple. No tracheal deviation present.  Cardiovascular: Normal rate, regular rhythm and intact distal pulses.   Pulmonary/Chest: Effort normal and breath sounds normal. No stridor. No respiratory distress. She has no wheezes. She has no rales.  Abdominal: Soft. Bowel sounds are normal. She exhibits no distension. There is no tenderness. There is no rebound and no guarding.  Musculoskeletal: She exhibits no edema.       Right knee: She exhibits no effusion.       Left knee: She exhibits ecchymosis. Tenderness found.       Right ankle: Normal.       Right upper leg: She exhibits tenderness.       Left upper leg: She exhibits tenderness.       Left foot: There is tenderness (ecchymoses in the left foot).  No spinal tenderness  Neurological: She is alert. She has normal strength. No cranial nerve deficit (no facial droop, extraocular movements intact, no slurred speech) or sensory deficit. She exhibits normal muscle tone. She displays no seizure activity. Coordination normal.  Skin: Skin is warm and dry. No rash noted.  Psychiatric: She has a normal mood and affect.  Nursing note and vitals reviewed.   ED Course  Procedures (including critical care time) Labs Review Labs Reviewed  APTT - Abnormal; Notable for the following:    aPTT 23 (*)    All other components within normal limits  CBC - Abnormal; Notable for the following:    WBC 17.5 (*)    RBC 3.68 (*)    Hemoglobin 11.3 (*)    HCT 33.0 (*)    All other components within normal limits  DIFFERENTIAL - Abnormal; Notable for the following:    Neutro Abs 16.2 (*)    All other components within normal limits  COMPREHENSIVE METABOLIC PANEL - Abnormal; Notable for the following:    Sodium 129 (*)    Chloride 95 (*)    CO2 21 (*)    Glucose, Bld 609 (*)    BUN 63 (*)    Creatinine, Ser 2.09 (*)    Calcium 8.5  (*)    Total Protein 6.2 (*)    Albumin 2.8 (*)    AST 151 (*)    ALT 81 (*)    Total Bilirubin 1.6 (*)    GFR calc non Af Amer 20 (*)    GFR calc Af Amer 24 (*)    All other components within normal limits  URINALYSIS,  ROUTINE W REFLEX MICROSCOPIC (NOT AT [facility]) - Abnormal; Notable for the following:    Glucose, UA >1000 (*)    Hgb urine dipstick LARGE (*)    Ketones, ur 15 (*)    Protein, ur 100 (*)    All other components within normal limits  URINE MICROSCOPIC-ADD ON - Abnormal; Notable for the following:    Squamous Epithelial / LPF 0-5 (*)    Bacteria, UA MANY (*)    Casts GRANULAR CAST (*)    All other components within normal limits  CBG MONITORING, ED - Abnormal; Notable for the following:    Glucose-Capillary 569 (*)    All other components within normal limits  CBG MONITORING, ED - Abnormal; Notable for the following:    Glucose-Capillary 597 (*)    All other components within normal limits  URINE CULTURE  ETHANOL  URINE RAPID DRUG SCREEN, HOSP PERFORMED    Imaging Review Dg Chest 1 View  03/23/2016  CLINICAL DATA:  Fall today with left-sided pain with weakness. EXAM: CHEST 1 VIEW COMPARISON:  09/12/2013 FINDINGS: Lungs are hypoinflated without consolidation or effusion. No pneumothorax. Mild cardiomegaly. Mild calcified plaque over the aortic arch. There are degenerative changes of the spine. IMPRESSION: No acute cardiopulmonary disease. Mild stable cardiomegaly. Aortic atherosclerosis. Electronically Signed   By: Marin Olp M.D.   On: 03/23/2016 18:24   Dg Ankle Complete Left  03/23/2016  CLINICAL DATA:  Fall today with left ankle pain. EXAM: LEFT ANKLE COMPLETE - 3+ VIEW COMPARISON:  None. FINDINGS: There is diffuse decreased bone mineralization. Ankle mortise is within normal. There is no acute fracture or dislocation. Inferior calcaneal spur. IMPRESSION: No acute findings. Electronically Signed   By: Marin Olp M.D.   On: 03/23/2016 18:22   Ct Head Wo  Contrast  03/23/2016  CLINICAL DATA:  Unable to ambulate 1 week. Covered in urine and stool. Two recent falls. EXAM: CT HEAD WITHOUT CONTRAST TECHNIQUE: Contiguous axial images were obtained from the base of the skull through the vertex without intravenous contrast. COMPARISON:  None. FINDINGS: Ventricles, cisterns and other CSF spaces are within normal. There is no mass, mass effect, shift of midline structures or acute hemorrhage. No evidence of acute infarction. Mild chronic ischemic microvascular disease. Bones and soft tissues are within normal. IMPRESSION: No acute intracranial findings. Chronic ischemic microvascular disease. Electronically Signed   By: Marin Olp M.D.   On: 03/23/2016 17:54   Dg Knee Complete 4 Views Left  03/23/2016  CLINICAL DATA:  Fall today with left knee pain. EXAM: LEFT KNEE - COMPLETE 4+ VIEW COMPARISON:  None. FINDINGS: There is diffuse decreased bone mineralization. Severe joint space loss involving the medial compartment with mild osteoarthritic change of the lateral compartment and patellofemoral joints. No acute fracture or dislocation. No significant joint effusion. IMPRESSION: No acute fracture. Moderate osteoarthritic change. Electronically Signed   By: Marin Olp M.D.   On: 03/23/2016 18:18   Dg Knee Complete 4 Views Right  03/23/2016  CLINICAL DATA:  Fall today with pain on left side with weakness EXAM: RIGHT KNEE - COMPLETE 4+ VIEW COMPARISON:  None. FINDINGS: There is significant degenerative change primarily involving the medial and patellofemoral compartments. No joint effusion or acute fracture. There is atherosclerotic calcification the popliteal artery. IMPRESSION: 1. Degenerative changes. 2.  No evidence for acute  abnormality. Electronically Signed   By: Nolon Nations M.D.   On: 03/23/2016 18:18   Dg Foot Complete Left  03/23/2016  CLINICAL DATA:  Fall today with left foot pain. EXAM: LEFT FOOT - COMPLETE 3+ VIEW COMPARISON:  None. FINDINGS:  Diffuse decreased bone mineralization. Minimal degenerate change over the midfoot. No acute fracture or dislocation. IMPRESSION: No acute findings. Electronically Signed   By: Marin Olp M.D.   On: 03/23/2016 18:20   Dg Femur Min 2 Views Left  03/23/2016  CLINICAL DATA:  Fall today with left femur pain. EXAM: LEFT FEMUR 2 VIEWS COMPARISON:  None. FINDINGS: There is mild diffuse decreased bone mineralization. There is no acute fracture or dislocation. Degenerative changes of the knee. IMPRESSION: No acute findings. Electronically Signed   By: Marin Olp M.D.   On: 03/23/2016 18:23   I have personally reviewed and evaluated these images and lab results as part of my medical decision-making.   EKG Interpretation   Date/Time:  Thursday March 23 2016 15:35:22 EDT Ventricular Rate:  83 PR Interval:    QRS Duration: 97 QT Interval:  400 QTC Calculation: 470 R Axis:   17 Text Interpretation:  Sinus rhythm Borderline repolarization abnormality  No old tracing to compare Confirmed by Isabeau Mccalla  MD-J, Que Meneely ([id number]) on  03/23/2016 4:09:18 PM      MDM   Final diagnoses:  Dehydration  Hyperglycemia  Yeast infection    Patient's laboratory tests are notable for an elevated white blood cell count. She also has evidence of acute renal failure as well as hyperglycemia.  I will add on a total CK considering her immobility. On skin exam the patient has erythema and skin breakdown in the perineum as well as the intertriginous areas beneath her breasts and around her abdomen.  I doubt cellulitis or necrotizing fasciitis this point. It seems to be more his breakdown. SHe has a yeast infection component.  X-rays do not show evidence of acute fracture.  I started the patient on IV fluids and insulin infusion. I prescribed nystatin cream. Consult with the medical service for admission and further treatment.  Dorie Rank, MD 03/27/16 1059

## 2016-03-23 NOTE — ED Notes (Signed)
CRITICAL VALUE ALERT  Critical value received:  Glucose 609  Date of notification:  03/23/16  Time of notification:  [id number] Critical value read back: yes  Nurse who received alert:  Sheral Flow MD notified (1st page):  Dr Tomi Bamberger

## 2016-03-23 NOTE — H&P (Addendum)
[facility] History and Physical  Diana Colon JSR:[id number] DOB: May 08, 1929 DOA: 03/23/2016  Referring physician: Dr Tomi Bamberger PCP: Odette Fraction, MD   Chief Complaint: Confusion, brought in my APS  HPI: Diana Colon is a 82 y.o. female with history of DM2,  HTN and depression brought to ED by AMS after APS called by pt's daughter found her on the floor in her home covered in stool/urine, BS 23 by EMS. In ED w/u shows CPK ^6000, creat 2.0, low Na, WBC 17 k and UA w bact/ gran casts/ hematuria.  Asked to see for admission for uncont DM, AMS, poss UTI/ infection.    Daughter provides history.  Patient was weak and fell a week ago on Thursday while at a community watch activity.  Since then she has not allowed the daughter into her house.  She fell again "Sunday and refused to go to the hospital, that was 4 days ago.  The daughter called Adult Protective Services and today the APS rep called EMS to get the patient to the hospital.  Per daughter patient grew up in [place], schooled thru the 3rd grade, can't read or write.  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    The daughter says her mother has been losing control of her home for several mos now.  She thinks that some kind of dementia might be setting in, but is not sure.  She thinks she will need a facility like a SNF or something.  Her insurance is [employer].  They haven't discussed code status.    The patient has one other child, a female, however the daughter says that he doesn't communicate with the patient.        ROS  no hx provided by patient  Past Medical History  Past Medical History  Diagnosis Date  . Depression   . Diabetes mellitus without complication ([facility])   . Hyperlipidemia   . Insomnia   . DJD (degenerative joint disease) of knee   . Hypertension   . OSA on CPAP     10"$   cwp  . Diabetic retinopathy [facility])    Past Surgical History  Past Surgical History  Procedure Laterality Date  . Eye surgery    . Appendectomy     Family History No family history on file. Social History  reports that she has quit smoking. Her smoking use included Cigarettes. She does not have any smokeless tobacco history on file. She reports that she does not drink alcohol or use illicit drugs. Allergies  Allergies  Allergen Reactions  . Ace Inhibitors Swelling  . Codeine Nausea And Vomiting  . Metformin And Related Diarrhea   Home medications Prior to Admission medications   Medication Sig Start Date End Date Taking? Authorizing Provider  aspirin EC 81 MG tablet Take 81 mg by mouth daily.   Yes Historical Provider, MD  Coenzyme Q10 (CO Q 10 PO) Take 1 tablet by mouth daily.   Yes Historical Provider, MD  CORAL CALCIUM PO Take 1 tablet by mouth daily.   Yes Historical Provider, MD  Cyanocobalamin (B-12 PO) Place 1 tablet under the tongue daily.   Yes Historical Provider, MD  escitalopram (LEXAPRO) 10 MG tablet TAKE ONE TABLET BY MOUTH DAILY. 10/29/15  Yes Susy Frizzle, MD  Insulin Glargine (LANTUS SOLOSTAR) 100 UNIT/ML Solostar Pen INJECT 52 UNITS  SUBCUTANEOUSLY AT BEDTIME. Patient taking differently: Inject 50 Units into the skin daily.  01/12/16  Yes Susy Frizzle, MD  omeprazole (PRILOSEC) 40 MG capsule Take 1 capsule (40 mg total) by mouth daily. 03/22/15  Yes Susy Frizzle, MD  sitaGLIPtin (JANUVIA) 100 MG tablet Take 1 tablet (100 mg total) by mouth daily. 04/29/15  Yes Susy Frizzle, MD  valsartan (DIOVAN) 320 MG tablet Take 1 tablet (320 mg total) by mouth daily. 01/10/16  Yes Susy Frizzle, MD  blood glucose meter kit and supplies KIT Dispense based on patient and insurance preference. Use up to four times daily as directed. (FOR ICD-9 250.00, 250.01). 10/27/14   Susy Frizzle, MD  glucose blood test strip 1 each by Other route 2 (two) times daily. Use as  instructed 10/27/14   Susy Frizzle, MD  Lancets 30G MISC 1 each by Does not apply route 2 (two) times daily. 10/27/14   Susy Frizzle, MD   Liver Function Tests  Recent Labs Lab 03/23/16 1638  AST 151*  ALT 81*  ALKPHOS 65  BILITOT 1.6*  PROT 6.2*  ALBUMIN 2.8*   No results for input(s): LIPASE, AMYLASE in the last 168 hours. CBC  Recent Labs Lab 03/23/16 1638  WBC 17.5*  NEUTROABS 16.2*  HGB 11.3*  HCT 33.0*  MCV 89.7  PLT 277   Basic Metabolic Panel  Recent Labs Lab 03/23/16 1638  NA 129*  K 4.9  CL 95*  CO2 21*  GLUCOSE 609*  BUN 63*  CREATININE 2.09*  CALCIUM 8.5*     Filed Vitals:   03/23/16 1800 03/23/16 1816 03/23/16 1850 03/23/16 1930  BP: 165/66  175/56 160/52  Pulse:   79 80  Temp:  98.1 F (36.7 C)    TempSrc:      Resp:   17 19  Height:      Weight:      SpO2:   96% 97%   Exam: Gen confused, weak, lethargic No rash, cyanosis or gangrene Sclera anicteric, throat is quite dry  No jvd or bruits, flat neck veins Chest clear bilat RRR 2/6 SEM no RG Abd soft ntnd obese, lower abd wall w rash which continues into both groins with confluent erythema intertrig areas, and linear rash on R buttock.  No blistering.   GU deferred MS no joint effusions or deformity Ext no LE or UE edema / no wounds or ulcers Neuro is lethargic, moves all ext, confused  Na 129 K 4.9  CO2 21  BUN 63  Cr 2.09 Ca 8.5  eGFR 20  Glu 609  Alb 2.8   AST 151  ALT 81  Tbili 1.6   CPK 6126 WBC 17k +left shift  Hb 11  plt 386  UA > many bact, gran casts, >1000 glu, 100 prot, 6-30 rbc, 0-5 WBC/ epi, pH 5.5   EKG (independ review) - NSR, no acute changes CXR (independ review) - normal CXR Xrays L foot/ ankle/ knee/ femur > no fractures seen   Assessment: 1  Altered mental status, multifactorial 2  UTI/ leukocytosis 3  Vol depletion 4  Renal insufficiency- poss ATN from rhabdo and/or vol depletion 5  Vol depletion 6  Borderline hypotension 7  Uncontrolled  DM, insulin -dependent w retinopathy 8  Candida rash of groin 9  Mild-mod rhabdomyolysis from immobility 10  HTN hold BP meds  Plan - admit, IVF w NaHCO3, IV abx, f/u urine cx's, fu Cr am, IV insulin  drip, NPO til awake, SW consult     Sol Blazing [facility] Pager [phone]  Cell [phone]  If 7PM-7AM, please contact night-coverage [email or web address] Password [email or web address] 03/23/2016, 8:20 PM

## 2016-03-24 DIAGNOSIS — R7989 Other specified abnormal findings of blood chemistry: Secondary | ICD-10-CM | POA: Diagnosis present

## 2016-03-24 DIAGNOSIS — N390 Urinary tract infection, site not specified: Secondary | ICD-10-CM

## 2016-03-24 DIAGNOSIS — M6282 Rhabdomyolysis: Secondary | ICD-10-CM

## 2016-03-24 DIAGNOSIS — R945 Abnormal results of liver function studies: Secondary | ICD-10-CM

## 2016-03-24 DIAGNOSIS — I9589 Other hypotension: Secondary | ICD-10-CM

## 2016-03-24 DIAGNOSIS — E869 Volume depletion, unspecified: Secondary | ICD-10-CM

## 2016-03-24 DIAGNOSIS — G9340 Encephalopathy, unspecified: Secondary | ICD-10-CM

## 2016-03-24 DIAGNOSIS — N289 Disorder of kidney and ureter, unspecified: Secondary | ICD-10-CM

## 2016-03-24 DIAGNOSIS — D649 Anemia, unspecified: Secondary | ICD-10-CM | POA: Diagnosis present

## 2016-03-24 LAB — COMPREHENSIVE METABOLIC PANEL
ALBUMIN: 2.3 g/dL — AB (ref 3.5–5.0)
ALK PHOS: 54 U/L (ref 38–126)
ALT: 77 U/L — ABNORMAL HIGH (ref 14–54)
ANION GAP: 8 (ref 5–15)
AST: 149 U/L — AB (ref 15–41)
BILIRUBIN TOTAL: 0.9 mg/dL (ref 0.3–1.2)
BUN: 54 mg/dL — AB (ref 6–20)
CALCIUM: 7.9 mg/dL — AB (ref 8.9–10.3)
CO2: 26 mmol/L (ref 22–32)
Chloride: 102 mmol/L (ref 101–111)
Creatinine, Ser: 1.5 mg/dL — ABNORMAL HIGH (ref 0.44–1.00)
GFR calc Af Amer: 35 mL/min — ABNORMAL LOW (ref >60)
GFR calc non Af Amer: 31 mL/min — ABNORMAL LOW (ref >60)
GLUCOSE: 152 mg/dL — AB (ref 65–99)
POTASSIUM: 3.8 mmol/L (ref 3.5–5.1)
SODIUM: 136 mmol/L (ref 135–145)
TOTAL PROTEIN: 5 g/dL — AB (ref 6.5–8.1)

## 2016-03-24 LAB — GLUCOSE, CAPILLARY
GLUCOSE-CAPILLARY: 111 mg/dL — AB (ref 65–99)
GLUCOSE-CAPILLARY: 133 mg/dL — AB (ref 65–99)
GLUCOSE-CAPILLARY: 313 mg/dL — AB (ref 65–99)
GLUCOSE-CAPILLARY: 330 mg/dL — AB (ref 65–99)
GLUCOSE-CAPILLARY: 288 mg/dL — AB (ref 65–99)
Glucose-Capillary: 120 mg/dL — ABNORMAL HIGH (ref 65–99)
Glucose-Capillary: 130 mg/dL — ABNORMAL HIGH (ref 65–99)
Glucose-Capillary: 153 mg/dL — ABNORMAL HIGH (ref 65–99)

## 2016-03-24 LAB — MRSA PCR SCREENING: MRSA by PCR: NEGATIVE

## 2016-03-24 LAB — CBC
HEMATOCRIT: 31.7 % — AB (ref 36.0–46.0)
HEMOGLOBIN: 11 g/dL — AB (ref 12.0–15.0)
MCH: 31.1 pg (ref 26.0–34.0)
MCHC: 34.7 g/dL (ref 30.0–36.0)
MCV: 89.5 fL (ref 78.0–100.0)
Platelets: 377 10*3/uL (ref 150–400)
RBC: 3.54 MIL/uL — AB (ref 3.87–5.11)
RDW: 12.2 % (ref 11.5–15.5)
WBC: 13.4 10*3/uL — ABNORMAL HIGH (ref 4.0–10.5)

## 2016-03-24 LAB — IRON AND TIBC
IRON: 24 ug/dL — AB (ref 28–170)
SATURATION RATIOS: 13 % (ref 10.4–31.8)
TIBC: 179 ug/dL — AB (ref 250–450)
UIBC: 155 ug/dL

## 2016-03-24 LAB — TSH: TSH: 1.254 u[IU]/mL (ref 0.350–4.500)

## 2016-03-24 LAB — VITAMIN B12: Vitamin B-12: 3971 pg/mL — ABNORMAL HIGH (ref 180–914)

## 2016-03-24 LAB — CK: CK TOTAL: 3284 U/L — AB (ref 38–234)

## 2016-03-24 MED ORDER — INSULIN ASPART 100 UNIT/ML ~~LOC~~ SOLN
0.0000 [IU] | Freq: Three times a day (TID) | SUBCUTANEOUS | Status: DC
  Administered 2016-03-24 – 2016-03-25 (×3): 11 [IU] via SUBCUTANEOUS
  Administered 2016-03-25: 2 [IU] via SUBCUTANEOUS
  Administered 2016-03-25: 15 [IU] via SUBCUTANEOUS
  Administered 2016-03-26: 8 [IU] via SUBCUTANEOUS
  Administered 2016-03-26: 3 [IU] via SUBCUTANEOUS
  Administered 2016-03-26 – 2016-03-27 (×2): 8 [IU] via SUBCUTANEOUS
  Administered 2016-03-27: 11 [IU] via SUBCUTANEOUS

## 2016-03-24 MED ORDER — INSULIN ASPART 100 UNIT/ML ~~LOC~~ SOLN
0.0000 [IU] | SUBCUTANEOUS | Status: DC
  Administered 2016-03-24: 3 [IU] via SUBCUTANEOUS
  Administered 2016-03-24: 2 [IU] via SUBCUTANEOUS

## 2016-03-24 MED ORDER — CETYLPYRIDINIUM CHLORIDE 0.05 % MT LIQD
7.0000 mL | Freq: Two times a day (BID) | OROMUCOSAL | Status: DC
  Administered 2016-03-24 – 2016-03-28 (×8): 7 mL via OROMUCOSAL

## 2016-03-24 MED ORDER — ASPIRIN 81 MG PO CHEW
81.0000 mg | CHEWABLE_TABLET | Freq: Every day | ORAL | Status: DC
  Administered 2016-03-24 – 2016-03-28 (×5): 81 mg via ORAL
  Filled 2016-03-24 (×5): qty 1

## 2016-03-24 MED ORDER — INSULIN GLARGINE 100 UNIT/ML ~~LOC~~ SOLN
10.0000 [IU] | Freq: Every day | SUBCUTANEOUS | Status: DC
  Administered 2016-03-24 (×2): 10 [IU] via SUBCUTANEOUS
  Filled 2016-03-24 (×4): qty 0.1

## 2016-03-24 MED ORDER — INSULIN ASPART 100 UNIT/ML ~~LOC~~ SOLN
0.0000 [IU] | Freq: Every day | SUBCUTANEOUS | Status: DC
  Administered 2016-03-24 – 2016-03-26 (×2): 3 [IU] via SUBCUTANEOUS

## 2016-03-24 MED ORDER — CITALOPRAM HYDROBROMIDE 20 MG PO TABS
20.0000 mg | ORAL_TABLET | Freq: Every day | ORAL | Status: DC
  Administered 2016-03-24 – 2016-03-28 (×5): 20 mg via ORAL
  Filled 2016-03-24 (×5): qty 1

## 2016-03-24 MED ORDER — POTASSIUM CHLORIDE IN NACL 20-0.9 MEQ/L-% IV SOLN
INTRAVENOUS | Status: DC
  Administered 2016-03-24: 130 mL/h via INTRAVENOUS
  Administered 2016-03-24: 10:00:00 via INTRAVENOUS

## 2016-03-24 MED ORDER — SODIUM BICARBONATE 8.4 % IV SOLN
INTRAVENOUS | Status: AC
  Filled 2016-03-24: qty 150

## 2016-03-24 NOTE — Progress Notes (Addendum)
PROGRESS NOTE    Diana Colon  [id number] DOB: [birth year] DOA: 03/23/2016 PCP: Odette Fraction, MD    Brief Narrative:  Patient is an 82 year old woman with a history of DM-2, OSA on CPAP, hypertension, and DJD, who presented to the ED on 03/23/16 after she was found down on her floor at home covered in stool and urine. Her daughter reported several falls several days prior to admission. In the ED, the patient was afebrile and hemodynamically stable. Radiographic studies revealed no acute fractures, but with DJD. CT scan of the head revealed no acute findings. Lab data were significant for a glucose of 609, sodium of 129, creatinine of 2.09, AST of 151/ALT of 81, total CK of 6126, and WBC of 17.5. Urinalysis revealed many bacteria. She was admitted for further evaluation and management of AMS, UTI and mild rhabdo.   Assessment & Plan:   Principal Problem:   Acute encephalopathy Active Problems:   UTI (urinary tract infection)   Hypotension   DM (diabetes mellitus), secondary, uncontrolled, w/renal complications (HCC)   Rhabdomyolysis   Acute renal insufficiency   Volume depletion   OSA on CPAP   Candida rash of groin   Elevated LFTs   Normocytic anemia   1. Urinary tract infection. Patient was started on Rocephin. Urine culture has been ordered and the results are pending. Patient denies pain with urination currently.  Hyperosmolar/nonketotic hyperglycemia. Patient's blood glucose was greater than 600. Her anion gap was within normal limits on admission, so she was apparently not in DKA. She denies missing her insulin; query noncompliance. -With vigorous IV fluids and insulin, her CBGs have improved. -We'll continue sliding scale NovoLog and Lantus. Will order hemoglobin A1c for evaluation.   Rhabdomyolysis. Patient was found down at home. Her CK was greater than 6000 on admission. Vigorous IV fluids with bicarbonate were started by the admitting physician. Follow-up CK is  pending. -We'll continue IV fluids, but will discontinue bicarbonate.  Found down at home with acute encephalopathy. Patient is currently alert and oriented with no signs of encephalopathy or neurological changes. Her presenting encephalopathy was likely secondary to the UTI and hyperglycemia. -We'll order TSH and vitamin B12 level for further evaluation.  Hypotension. Patient's blood pressure was within normal limits on admission, but has trended down. She likely has   hypovolemia and not sepsis. As above, she was started on vigorous IV fluids. Her blood pressure is marginal, but I do not believe she needs a pressor. -We'll continue vigorous IV fluids without bicarbonate.  Hyponatremia. Patient serum sodium was 129 on admission. With vigorous IV fluids, her serum sodium has normalized. Etiology was secondary to hypovolemia.  Acute kidney injury superimposed on probable stage II chronic kidney disease. Patient's creatinine 3 months ago was 1.27. It was 2.09 on admission. The increase was likely due to hypovolemia/prerenal azotemia and possibly ATN. --Creatinine has improved with hydration. We'll continue to monitor.  Elevated LFTs. Patient has no abdominal pain on exam and has no history of hepatitis. The elevated LFTs are likely from rhabdomyolysis. We'll continue to follow.   DVT prophylaxis: Lovenox  Code Status: Full code Family Communication: Family not available. Disposition Plan: Discharge when clinically appropriate; patient may require SNF placement.   Consultants:   None  Procedures:  None  Antimicrobials:   Rocephin 03/23/16   Subjective: Patient is lying in bed asleep, but easily arousable. She denies chest pain, shortness of breath, dizziness, or headache.  Objective: Filed Vitals:   03/24/16 0515 03/24/16 0600  03/24/16 0700 03/24/16 0800  BP: 98/30 88/53 85/32    Pulse: 71 63 63   Temp:    97.1 F (36.2 C)  TempSrc:    Oral  Resp: 18 16 16    Height:       Weight:      SpO2: 100% 100% 100%     Intake/Output Summary (Last 24 hours) at 03/24/16 0856 Last data filed at 03/24/16 0600  Gross per 24 hour  Intake 1212.4 ml  Output    550 ml  Net  662.4 ml   Filed Weights   03/23/16 1536 03/23/16 2202 03/24/16 0500  Weight: 99.791 kg (220 lb) 94.6 kg (208 lb 8.9 oz) 94.6 kg (208 lb 8.9 oz)    Examination:  General exam: Appears calm and comfortable; elderly 82 year old woman sleeping, but arousable and in no acute distress.  Respiratory system: Clear anteriorly with decreased breath sounds in the bases. Respiratory effort normal. Cardiovascular system: S1 & S2 with 2/6 systolic murmur. No pedal edema. Gastrointestinal system: Abdomen is mildly obese nondistended, soft and nontender. No organomegaly or masses felt. Normal bowel sounds heard. Central nervous system: Alert and oriented. No focal neurological deficits. Extremities: Symmetric 5 x 5 power. Skin: No rashes, lesions or ulcers Psychiatry: Judgement and insight appear normal. Mood & affect appropriate. Speech is clear.     Data Reviewed: I have personally reviewed following labs and imaging studies  CBC:  Recent Labs Lab 03/23/16 1638  WBC 17.5*  NEUTROABS 16.2*  HGB 11.3*  HCT 33.0*  MCV 89.7  PLT [id number]   Basic Metabolic Panel:  Recent Labs Lab 03/23/16 1638 03/24/16 0410  NA 129* 136  K 4.9 3.8  CL 95* 102  CO2 21* 26  GLUCOSE 609* 152*  BUN 63* 54*  CREATININE 2.09* 1.50*  CALCIUM 8.5* 7.9*   GFR: Estimated Creatinine Clearance: 29.4 mL/min (by C-G formula based on Cr of 1.5). Liver Function Tests:  Recent Labs Lab 03/23/16 1638 03/24/16 0410  AST 151* 149*  ALT 81* 77*  ALKPHOS 65 54  BILITOT 1.6* 0.9  PROT 6.2* 5.0*  ALBUMIN 2.8* 2.3*   No results for input(s): LIPASE, AMYLASE in the last 168 hours. No results for input(s): AMMONIA in the last 168 hours. Coagulation Profile: No results for input(s): INR, PROTIME in the last 168  hours. Cardiac Enzymes:  Recent Labs Lab 03/23/16 1638  CKTOTAL 6126*   BNP (last 3 results) No results for input(s): PROBNP in the last 8760 hours. HbA1C: No results for input(s): HGBA1C in the last 72 hours. CBG:  Recent Labs Lab 03/24/16 0039 03/24/16 0140 03/24/16 0242 03/24/16 0338 03/24/16 0754  GLUCAP 120* 111* 130* 153* 133*   Lipid Profile: No results for input(s): CHOL, HDL, LDLCALC, TRIG, CHOLHDL, LDLDIRECT in the last 72 hours. Thyroid Function Tests: No results for input(s): TSH, T4TOTAL, FREET4, T3FREE, THYROIDAB in the last 72 hours. Anemia Panel: No results for input(s): VITAMINB12, FOLATE, FERRITIN, TIBC, IRON, RETICCTPCT in the last 72 hours. Sepsis Labs: No results for input(s): PROCALCITON, LATICACIDVEN in the last 168 hours.  Recent Results (from the past 240 hour(s))  MRSA PCR Screening     Status: None   Collection Time: 03/23/16 10:00 PM  Result Value Ref Range Status   MRSA by PCR NEGATIVE NEGATIVE Final    Comment:        The GeneXpert MRSA Assay (FDA approved for NASAL specimens only), is one component of a comprehensive MRSA colonization surveillance program. It is not  intended to diagnose MRSA infection nor to guide or monitor treatment for MRSA infections.          Radiology Studies: Dg Chest 1 View  03/23/2016  CLINICAL DATA:  Fall today with left-sided pain with weakness. EXAM: CHEST 1 VIEW COMPARISON:  09/12/2013 FINDINGS: Lungs are hypoinflated without consolidation or effusion. No pneumothorax. Mild cardiomegaly. Mild calcified plaque over the aortic arch. There are degenerative changes of the spine. IMPRESSION: No acute cardiopulmonary disease. Mild stable cardiomegaly. Aortic atherosclerosis. Electronically Signed   By: Marin Olp M.D.   On: 03/23/2016 18:24   Dg Ankle Complete Left  03/23/2016  CLINICAL DATA:  Fall today with left ankle pain. EXAM: LEFT ANKLE COMPLETE - 3+ VIEW COMPARISON:  None. FINDINGS: There is  diffuse decreased bone mineralization. Ankle mortise is within normal. There is no acute fracture or dislocation. Inferior calcaneal spur. IMPRESSION: No acute findings. Electronically Signed   By: Marin Olp M.D.   On: 03/23/2016 18:22   Ct Head Wo Contrast  03/23/2016  CLINICAL DATA:  Unable to ambulate 1 week. Covered in urine and stool. Two recent falls. EXAM: CT HEAD WITHOUT CONTRAST TECHNIQUE: Contiguous axial images were obtained from the base of the skull through the vertex without intravenous contrast. COMPARISON:  None. FINDINGS: Ventricles, cisterns and other CSF spaces are within normal. There is no mass, mass effect, shift of midline structures or acute hemorrhage. No evidence of acute infarction. Mild chronic ischemic microvascular disease. Bones and soft tissues are within normal. IMPRESSION: No acute intracranial findings. Chronic ischemic microvascular disease. Electronically Signed   By: Marin Olp M.D.   On: 03/23/2016 17:54   Dg Knee Complete 4 Views Left  03/23/2016  CLINICAL DATA:  Fall today with left knee pain. EXAM: LEFT KNEE - COMPLETE 4+ VIEW COMPARISON:  None. FINDINGS: There is diffuse decreased bone mineralization. Severe joint space loss involving the medial compartment with mild osteoarthritic change of the lateral compartment and patellofemoral joints. No acute fracture or dislocation. No significant joint effusion. IMPRESSION: No acute fracture. Moderate osteoarthritic change. Electronically Signed   By: Marin Olp M.D.   On: 03/23/2016 18:18   Dg Knee Complete 4 Views Right  03/23/2016  CLINICAL DATA:  Fall today with pain on left side with weakness EXAM: RIGHT KNEE - COMPLETE 4+ VIEW COMPARISON:  None. FINDINGS: There is significant degenerative change primarily involving the medial and patellofemoral compartments. No joint effusion or acute fracture. There is atherosclerotic calcification the popliteal artery. IMPRESSION: 1. Degenerative changes. 2.  No evidence  for acute  abnormality. Electronically Signed   By: Nolon Nations M.D.   On: 03/23/2016 18:18   Dg Foot Complete Left  03/23/2016  CLINICAL DATA:  Fall today with left foot pain. EXAM: LEFT FOOT - COMPLETE 3+ VIEW COMPARISON:  None. FINDINGS: Diffuse decreased bone mineralization. Minimal degenerate change over the midfoot. No acute fracture or dislocation. IMPRESSION: No acute findings. Electronically Signed   By: Marin Olp M.D.   On: 03/23/2016 18:20   Dg Femur Min 2 Views Left  03/23/2016  CLINICAL DATA:  Fall today with left femur pain. EXAM: LEFT FEMUR 2 VIEWS COMPARISON:  None. FINDINGS: There is mild diffuse decreased bone mineralization. There is no acute fracture or dislocation. Degenerative changes of the knee. IMPRESSION: No acute findings. Electronically Signed   By: Marin Olp M.D.   On: 03/23/2016 18:23        Scheduled Meds: . antiseptic oral rinse  7 mL Mouth Rinse  BID  . barrier cream  1 application Topical BID  . cefTRIAXone (ROCEPHIN)  IV  2 g Intravenous Q24H  . enoxaparin (LOVENOX) injection  30 mg Subcutaneous Q24H  . insulin aspart  0-15 Units Subcutaneous Q4H  . insulin glargine  10 Units Subcutaneous QHS  . nystatin   Topical BID  . nystatin cream  1 application Topical BID   Continuous Infusions: .  sodium bicarbonate 150 mEq in sterile water 1000 mL infusion 150 mL/hr at 03/24/16 0600     LOS: 1 day    Time spent: 40 minutes    Rexene Alberts, MD [facility] Pager [phone]  If 7PM-7AM, please contact night-coverage [email or web address] Password [facility] 03/24/2016, 8:56 AM

## 2016-03-24 NOTE — Care Management Note (Signed)
Case Management Note  Patient Details  Name: Diana Colon MRN: [id number] Date of Birth: [birth year]  Subjective/Objective:                  Pt is from home, lives alone, daughter at the bedside. Pt is ind with ADL's at baseline. Pt has cane that she uses as needed. Pt fell multiple times recently and feels she is going to ned STR at [facility] is aware and has assessed pt and will make arrangements for placement. PT eval pending.  Action/Plan: Anticipate DC to SNF. No CM needs anticipated.   Expected Discharge Date:  03/27/16               Expected Discharge Plan:  Skilled Nursing Facility  In-House Referral:  Clinical Social Work  Discharge planning Services  CM Consult  Post Acute Care Choice:  NA Choice offered to:  NA  DME Arranged:    DME Agency:     HH Arranged:    HH Agency:     Status of Service:  In process, will continue to follow  If discussed at Long Length of Stay Meetings, dates discussed:    Additional Comments:  Sherald Barge, RN 03/24/2016, 2:19 PM

## 2016-03-24 NOTE — NC FL2 (Signed)
[place] LEVEL OF CARE SCREENING TOOL     IDENTIFICATION  Patient Name: Diana Colon Birthdate: 09-14-1929 Sex: female Admission Date (Current Location): 03/23/2016  [facility] and [place] Number:  [employer] and Address:  [facility] [address] [place]      Provider Number: [id number]  Attending Physician Name and Address:  Rexene Alberts, MD  Relative Name and Phone Number:       Current Level of Care: Hospital Recommended Level of Care: [place] Prior Approval Number:    Date Approved/Denied:   PASRR Number:  ([id number])  Discharge Plan: SNF    Current Diagnoses: Patient Active Problem List   Diagnosis Date Noted  . Elevated LFTs 03/24/2016  . Normocytic anemia 03/24/2016  . Acute encephalopathy 03/23/2016  . UTI (urinary tract infection) 03/23/2016  . Acute renal insufficiency 03/23/2016  . Volume depletion 03/23/2016  . Hypotension 03/23/2016  . DM (diabetes mellitus), secondary, uncontrolled, w/renal complications ([place]) Q000111Q  . Candida rash of groin 03/23/2016  . Rhabdomyolysis 03/23/2016  . Uncontrolled diabetes mellitus ([place]) 03/23/2016  . OSA on CPAP   . Depression   . Diabetes mellitus without complication ([place])   . Hyperlipidemia   . Insomnia   . DJD (degenerative joint disease) of knee   . Hypertension     Orientation RESPIRATION BLADDER Height & Weight     Self, Time, Situation, Place  Normal Incontinent Weight: 208 lb 8.9 oz (94.6 kg) Height:  5\' 2"  (157.5 cm)  BEHAVIORAL SYMPTOMS/MOOD NEUROLOGICAL BOWEL NUTRITION STATUS      Incontinent Diet (Carb Modified)  AMBULATORY STATUS COMMUNICATION OF NEEDS Skin   Extensive Assist Verbally Other (Comment) (Moisture associated skin damage: abdoment, groin, labia)                       Personal Care Assistance Level of Assistance  Bathing, Dressing Bathing Assistance: Maximum assistance   Dressing Assistance:  Maximum assistance     Functional Limitations Info  Sight, Hearing, Speech Sight Info: Adequate Hearing Info: Adequate Speech Info: Adequate    SPECIAL CARE FACTORS FREQUENCY  PT (By licensed PT)                    Contractures      Additional Factors Info  Psychotropic, Insulin Sliding Scale, Allergies, Code Status Code Status Info: full code Allergies Info: Ace Inhibitors, Codeine, Metformin and related. Psychotropic Info: Celexa         Current Medications (03/24/2016):  This is the current hospital active medication list Current Facility-Administered Medications  Medication Dose Route Frequency Provider Last Rate Last Dose  . 0.9 % NaCl with KCl 20 mEq/ L  infusion   Intravenous Continuous Rexene Alberts, MD 130 mL/hr at 03/24/16 0955    . antiseptic oral rinse (CPC / CETYLPYRIDINIUM CHLORIDE 0.05%) solution 7 mL  7 mL Mouth Rinse BID Roney Jaffe, MD   7 mL at 03/24/16 1000  . aspirin chewable tablet 81 mg  81 mg Oral Daily Rexene Alberts, MD   81 mg at 03/24/16 0954  . barrier cream (non-specified) 1 application  1 application Topical BID Roney Jaffe, MD   1 application at 0000000 1000  . cefTRIAXone (ROCEPHIN) 2 g in dextrose 5 % 50 mL IVPB  2 g Intravenous Q24H Roney Jaffe, MD      . citalopram (CELEXA) tablet 20 mg  20 mg Oral Daily Rexene Alberts, MD  20 mg at 03/24/16 0954  . enoxaparin (LOVENOX) injection 30 mg  30 mg Subcutaneous Q24H Roney Jaffe, MD   30 mg at 03/23/16 2324  . insulin aspart (novoLOG) injection 0-15 Units  0-15 Units Subcutaneous TID WC Rexene Alberts, MD   11 Units at 03/24/16 1144  . insulin aspart (novoLOG) injection 0-5 Units  0-5 Units Subcutaneous QHS Rexene Alberts, MD      . insulin glargine (LANTUS) injection 10 Units  10 Units Subcutaneous QHS Orvan Falconer, MD   10 Units at 03/24/16 0402  . nystatin (MYCOSTATIN/NYSTOP) topical powder   Topical BID Roney Jaffe, MD      . nystatin cream (MYCOSTATIN) 1 application  1 application  Topical BID Dorie Rank, MD   1 application at [phone] [id number]     Discharge Medications: Please see discharge summary for a list of discharge medications.  Relevant Imaging Results:  Relevant Lab Results:   Additional Information    Tanzie Rothschild, Clydene Pugh, LCSW

## 2016-03-24 NOTE — Progress Notes (Signed)
Patient was asked if she wore a CPAP or BIPAP unit at home and patient wasn't clear on what she wore. I talked to patients nurse after talking to her and she will continue to wear a nasal cannula through the night and if needed patient will be placed on CPAP.

## 2016-03-24 NOTE — Progress Notes (Signed)
Nutrition Brief Note  Patient identified on the [place] Report  Wt Readings from Last 15 Encounters:  03/24/16 208 lb 8.9 oz (94.6 kg)  12/24/15 210 lb (95.255 kg)  03/22/15 215 lb (97.523 kg)  11/09/14 211 lb (95.709 kg)  03/19/14 217 lb 7 oz (98.629 kg)  01/15/14 221 lb (100.245 kg)  10/23/13 226 lb (102.513 kg)  09/12/13 226 lb (102.513 kg)  04/14/13 218 lb (98.884 kg)    Body mass index is 38.14 kg/(m^2). Patient meets criteria for OBese based on current BMI.   Current diet order is Carb Modified, patient is consuming approximately 100% of meals at this time.  RD stopped by to speak with patient. She was asleep and when awoken her answers were not appropriate for the questions asked.   RD to monitor PO intake, but she appears to be eating well at this time.   No nutrition interventions warranted at this time. If nutrition issues arise, please consult RD.   Burtis Junes RD, LDN, CNSC Clinical Nutrition Pager: [phone] 03/24/2016 2:46 PM
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
## 2016-03-24 NOTE — Care Management Important Message (Signed)
Important Message  Patient Details  Name: Diana Colon MRN: [id number] Date of Birth: October 16, 1928   Medicare Important Message Given:  Yes    Sherald Barge, RN 03/24/2016, 12:51 PM

## 2016-03-24 NOTE — Progress Notes (Addendum)
Inpatient Diabetes Program Recommendations  AACE/ADA: New Consensus Statement on Inpatient Glycemic Control (2015)  Target Ranges:  Prepandial:   less than 140 mg/dL      Peak postprandial:   less than 180 mg/dL (1-2 hours)      Critically ill patients:  140 - 180 mg/dL    Results for Diana Colon, Diana Colon (MRN [id number]) as of 03/24/2016 15:01  Ref. Range 03/23/2016 15:25 03/23/2016 18:22 03/23/2016 19:26 03/23/2016 20:28 03/23/2016 21:25 03/23/2016 22:36 03/23/2016 23:30 03/24/2016 00:39 03/24/2016 01:40 03/24/2016 02:42 03/24/2016 03:38 03/24/2016 07:54 03/24/2016 11:16  Glucose-Capillary Latest Ref Range: 65-99 mg/dL 569 (HH) 597 (HH) 503 (H) 467 (H) 413 (H) 280 (H) 195 (H) 120 (H) 111 (H) 130 (H) 153 (H) 133 (H) 313 (H)    Admit AMS/ UTI/ Hyperglycemia.   History: DM  Home DM Meds: Lantus 50 units daily      Januvia 100 mg daily  Current Insulin Orders: Lantus 10 units QHS (1st dose given at 4am to transition off IV Insulin drip)       Novolog Moderate Correction Scale/ SSI (0-15 units) TID AC + HS     MD- Please consider starting Novolog Meal Coverage-  Novolog 4 units tidwc (hold if pt eats <50% of meal)  Patient may also require more Lantus insulin- Please titrate Lantus upward if fasting glucose elevated tomorrow AM    --Will follow patient during hospitalization--  Wyn Quaker RN, MSN, CDE Diabetes Coordinator Inpatient Glycemic Control Team Team Pager: [phone] (8a-5p)

## 2016-03-25 DIAGNOSIS — B3789 Other sites of candidiasis: Secondary | ICD-10-CM

## 2016-03-25 DIAGNOSIS — D649 Anemia, unspecified: Secondary | ICD-10-CM

## 2016-03-25 LAB — CBC
HEMATOCRIT: 33.6 % — AB (ref 36.0–46.0)
Hemoglobin: 11.2 g/dL — ABNORMAL LOW (ref 12.0–15.0)
MCH: 30.6 pg (ref 26.0–34.0)
MCHC: 33.3 g/dL (ref 30.0–36.0)
MCV: 91.8 fL (ref 78.0–100.0)
PLATELETS: 301 10*3/uL (ref 150–400)
RBC: 3.66 MIL/uL — ABNORMAL LOW (ref 3.87–5.11)
RDW: 12.6 % (ref 11.5–15.5)
WBC: 12.9 10*3/uL — AB (ref 4.0–10.5)

## 2016-03-25 LAB — COMPREHENSIVE METABOLIC PANEL
ALBUMIN: 2.2 g/dL — AB (ref 3.5–5.0)
ALK PHOS: 54 U/L (ref 38–126)
ALT: 71 U/L — ABNORMAL HIGH (ref 14–54)
ANION GAP: 5 (ref 5–15)
AST: 83 U/L — ABNORMAL HIGH (ref 15–41)
BILIRUBIN TOTAL: 0.4 mg/dL (ref 0.3–1.2)
BUN: 44 mg/dL — ABNORMAL HIGH (ref 6–20)
CALCIUM: 7.9 mg/dL — AB (ref 8.9–10.3)
CO2: 26 mmol/L (ref 22–32)
Chloride: 106 mmol/L (ref 101–111)
Creatinine, Ser: 1.34 mg/dL — ABNORMAL HIGH (ref 0.44–1.00)
GFR, EST AFRICAN AMERICAN: 41 mL/min — AB (ref >60)
GFR, EST NON AFRICAN AMERICAN: 35 mL/min — AB (ref >60)
GLUCOSE: 135 mg/dL — AB (ref 65–99)
POTASSIUM: 4.1 mmol/L (ref 3.5–5.1)
Sodium: 137 mmol/L (ref 135–145)
TOTAL PROTEIN: 5.1 g/dL — AB (ref 6.5–8.1)

## 2016-03-25 LAB — GLUCOSE, CAPILLARY
GLUCOSE-CAPILLARY: 128 mg/dL — AB (ref 65–99)
GLUCOSE-CAPILLARY: 211 mg/dL — AB (ref 65–99)
GLUCOSE-CAPILLARY: 258 mg/dL — AB (ref 65–99)
GLUCOSE-CAPILLARY: 308 mg/dL — AB (ref 65–99)
Glucose-Capillary: 136 mg/dL — ABNORMAL HIGH (ref 65–99)
Glucose-Capillary: 352 mg/dL — ABNORMAL HIGH (ref 65–99)

## 2016-03-25 LAB — C DIFFICILE QUICK SCREEN W PCR REFLEX
C DIFFICILE (CDIFF) INTERP: NEGATIVE
C DIFFICLE (CDIFF) ANTIGEN: NEGATIVE
C Diff toxin: NEGATIVE

## 2016-03-25 LAB — URINE CULTURE: CULTURE: NO GROWTH

## 2016-03-25 LAB — CK: Total CK: 1087 U/L — ABNORMAL HIGH (ref 38–234)

## 2016-03-25 IMAGING — CR DG ABDOMEN 2V
4 series · 4 of 4 positions shown · non-contrast
Comparison: None.

CLINICAL DATA: Patient's concern that she broke an insulin needle
off in the soft tissue of the left side upper abdomen.

EXAM:
ABDOMEN - 2 VIEW

[view not recorded (1 of 4)]
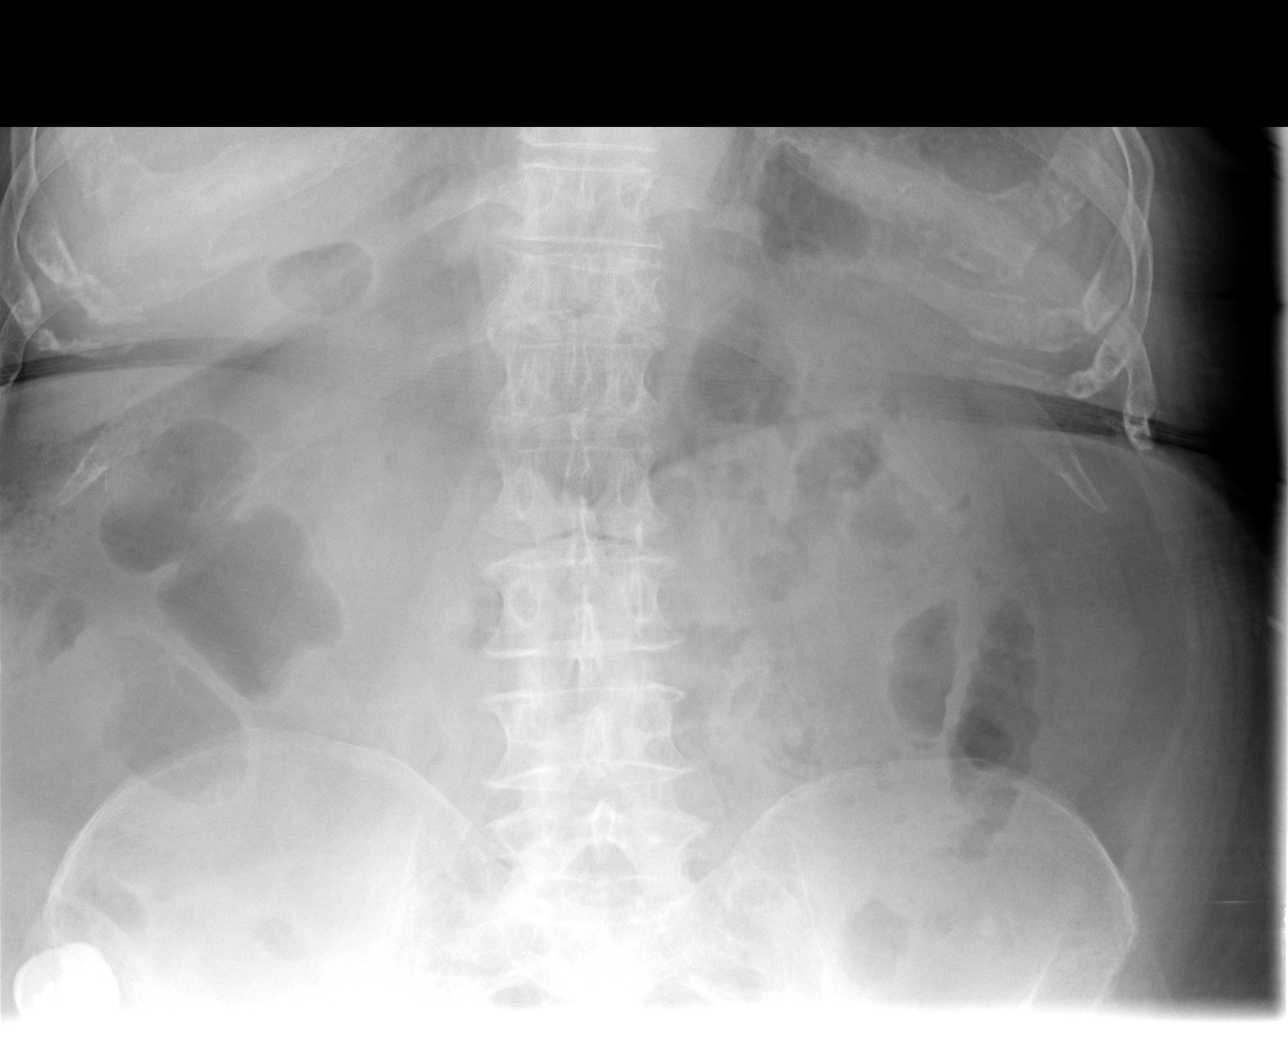

[view not recorded (2 of 4)]
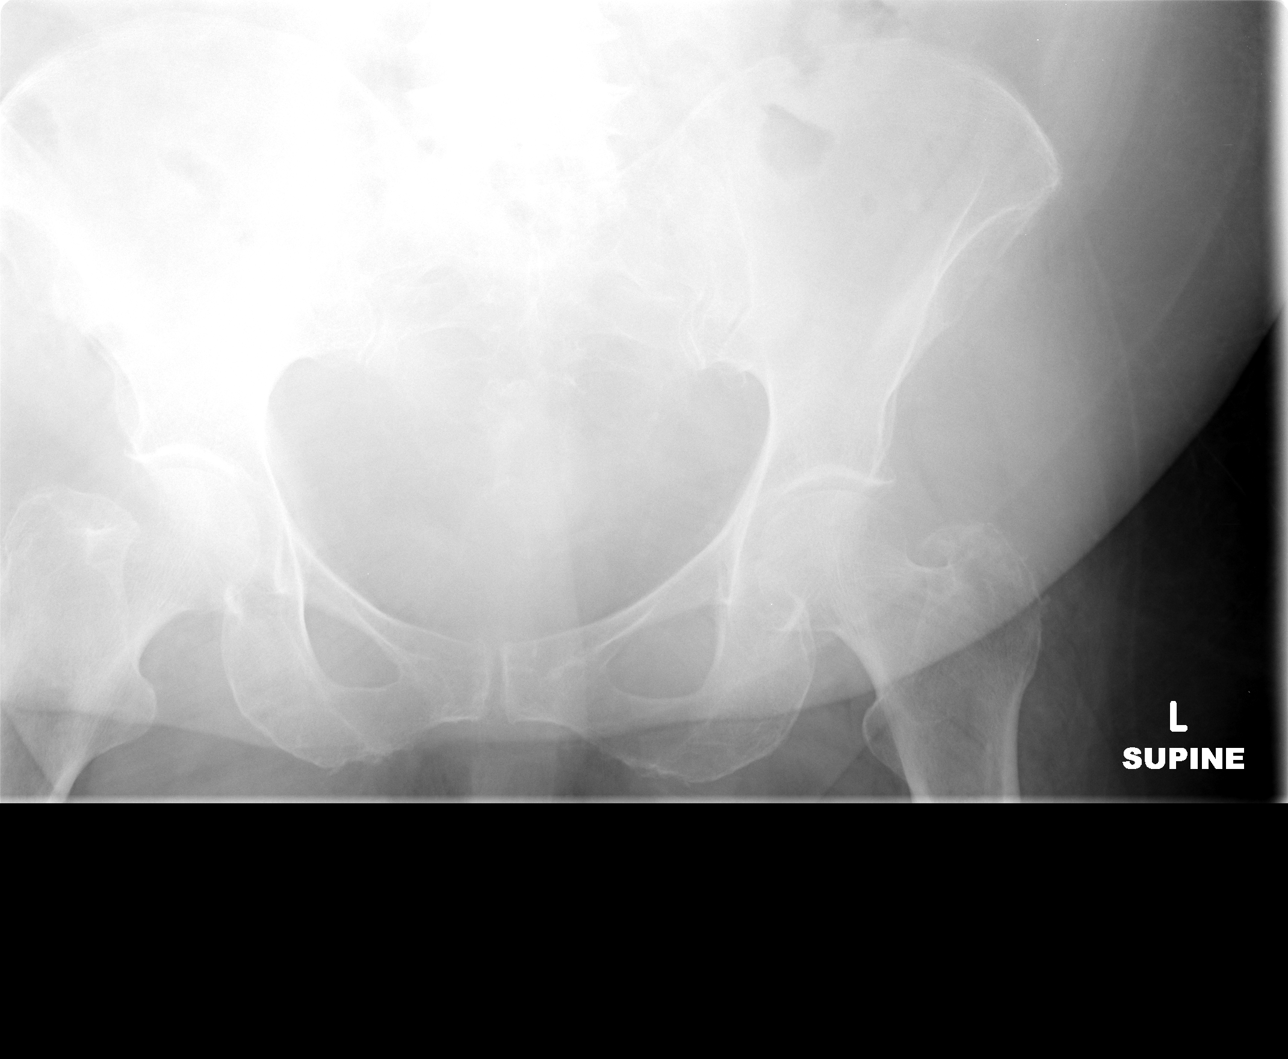

[view not recorded (3 of 4)]
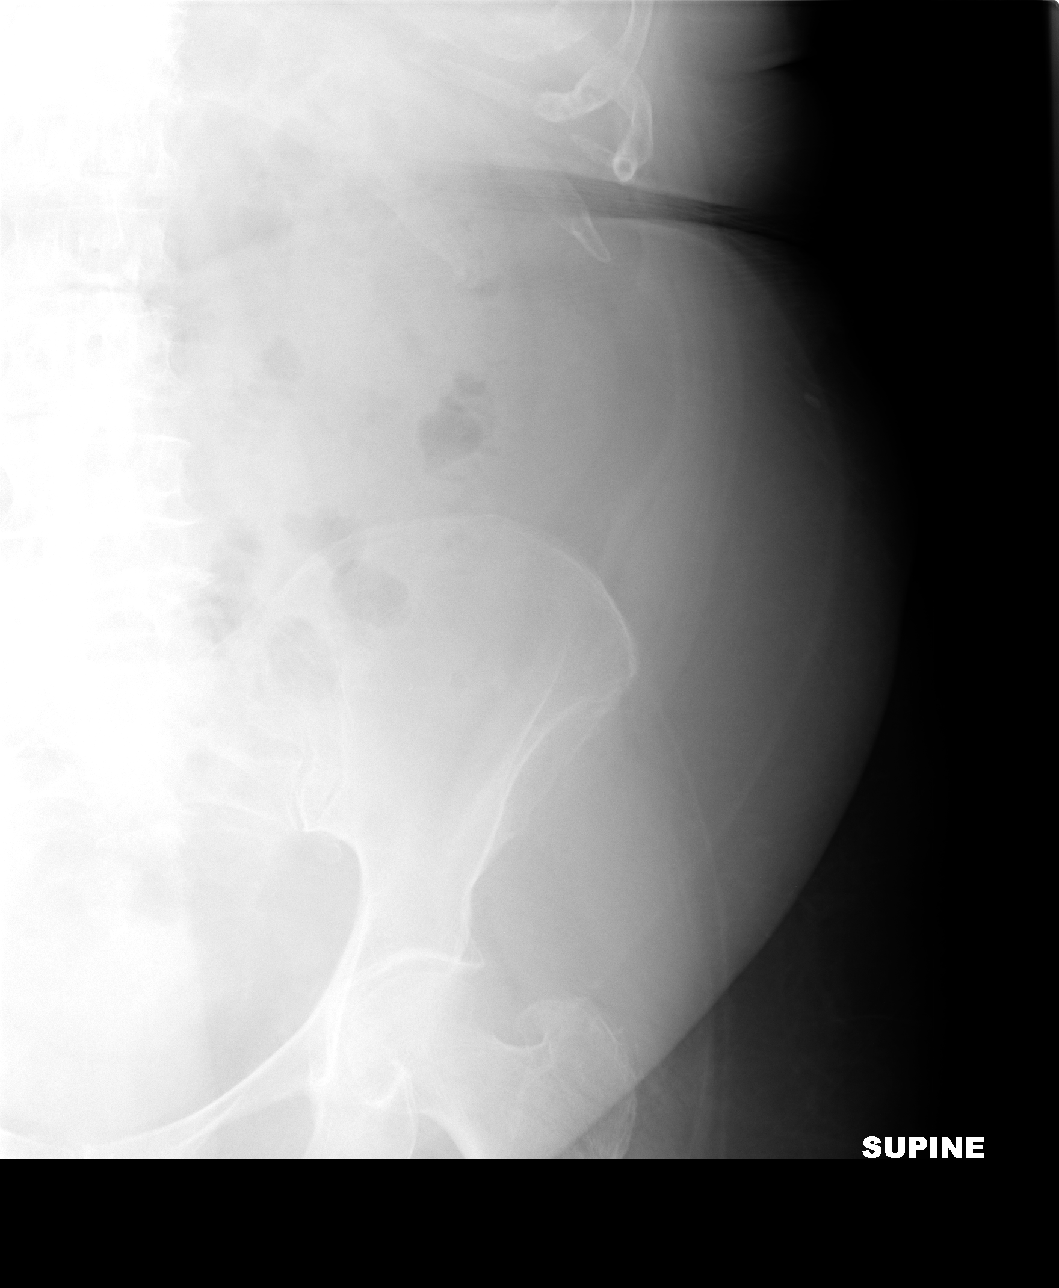

[view not recorded (4 of 4)]
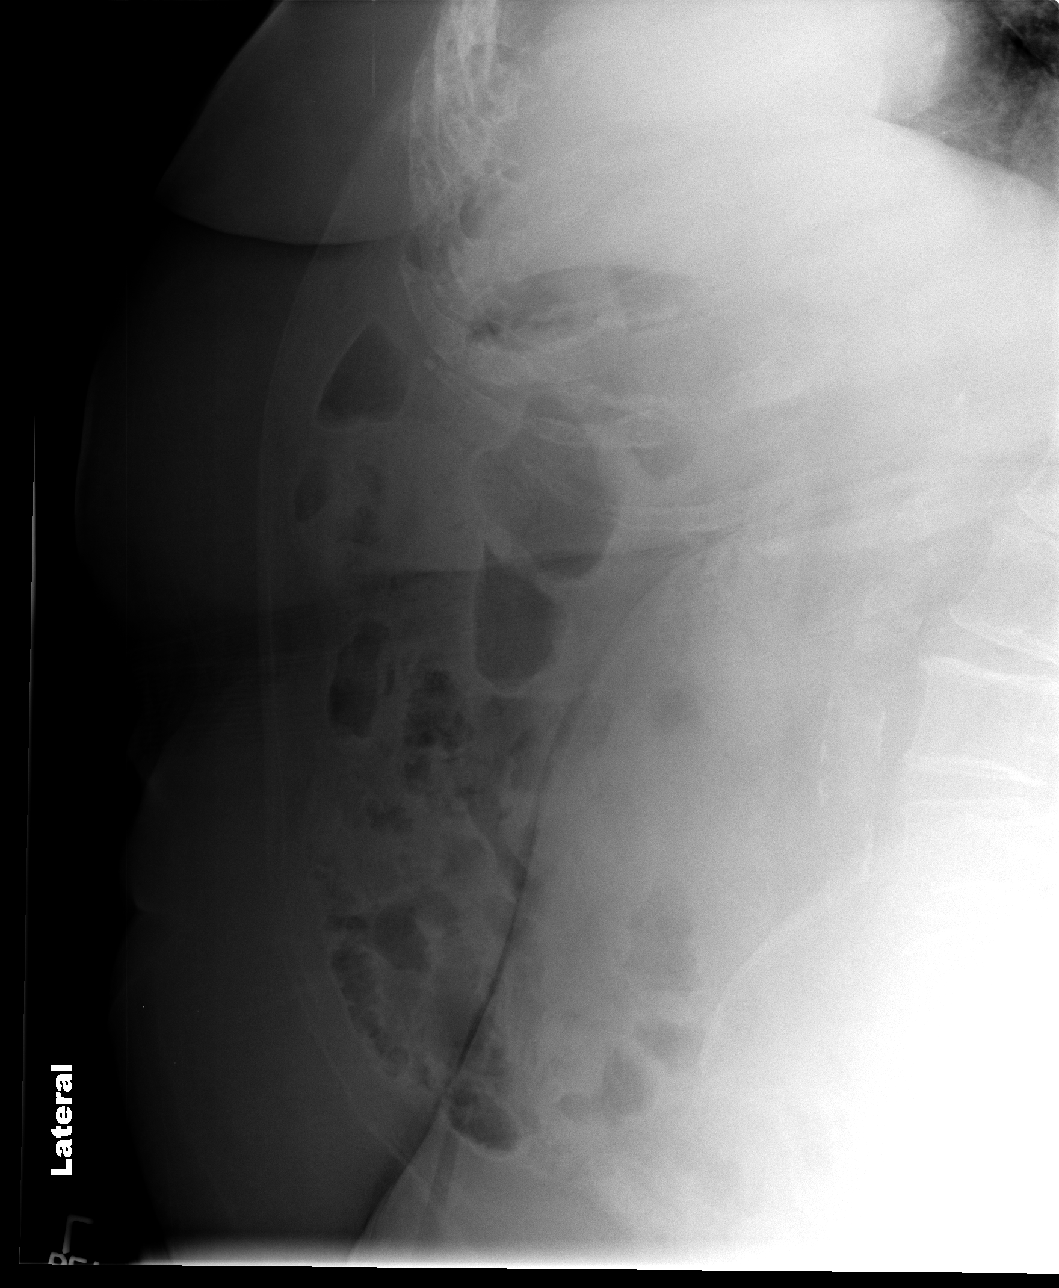

[4 of 4 positions shown; findings below may reference images not displayed]

FINDINGS: Bowel gas pattern is nonobstructive. A linear density overlies the
lower chest on the lateral view but is favored to represent
artifact. Correlation with history and physical exam recommended.
Repeat views or additional imaging can be performed as needed to
evaluate this further. A similar density is identified in the soft
tissues of the left lower quadrant, also favored to be artifactual.

Chronic wedge compression fracture at T12.
IMPRESSION: 1. Linear density overlying the lower chest as described. This is
favored to be artifactual.
2. As needed, further evaluation can be performed.

## 2016-03-25 MED ORDER — OXYCODONE HCL 5 MG PO TABS
5.0000 mg | ORAL_TABLET | Freq: Four times a day (QID) | ORAL | Status: DC | PRN
  Filled 2016-03-25: qty 1

## 2016-03-25 MED ORDER — INSULIN GLARGINE 100 UNIT/ML ~~LOC~~ SOLN
25.0000 [IU] | Freq: Every day | SUBCUTANEOUS | Status: DC
  Administered 2016-03-25 – 2016-03-26 (×2): 25 [IU] via SUBCUTANEOUS
  Filled 2016-03-25 (×3): qty 0.25

## 2016-03-25 MED ORDER — IRBESARTAN 150 MG PO TABS
150.0000 mg | ORAL_TABLET | Freq: Every day | ORAL | Status: DC
  Administered 2016-03-25: 150 mg via ORAL
  Filled 2016-03-25: qty 1

## 2016-03-25 NOTE — Progress Notes (Signed)
Report called to [facility] on 300. Patient transferred to room 332 via bed.

## 2016-03-25 NOTE — Progress Notes (Signed)
Patient refusing CPAP no unit in room at this time. 

## 2016-03-25 NOTE — Progress Notes (Signed)
PROGRESS NOTE    Diana Colon  [id number] DOB: 1929/03/28 DOA: 03/23/2016 PCP: Odette Fraction, MD    Brief Narrative:  Patient is an 82 year old woman with a history of DM-2, OSA on CPAP, hypertension, and DJD, who presented to the ED on 03/23/16 after she was found down on her floor at home covered in stool and urine. Her daughter reported several falls several days prior to admission. In the ED, the patient was afebrile and hemodynamically stable. Radiographic studies revealed no acute fractures, but with DJD. CT scan of the head revealed no acute findings. Lab data were significant for a glucose of 609, sodium of 129, creatinine of 2.09, AST of 151/ALT of 81, total CK of 6126, and WBC of 17.5. Urinalysis revealed many bacteria. She was admitted for further evaluation and management of AMS, UTI and mild rhabdo.   Assessment & Plan:   Principal Problem:   Acute encephalopathy Active Problems:   UTI (urinary tract infection)   Hypotension   DM (diabetes mellitus), secondary, uncontrolled, w/renal complications (HCC)   Rhabdomyolysis   Acute renal insufficiency   Volume depletion   OSA on CPAP   Candida rash of groin   Elevated LFTs   Normocytic anemia   1. Urinary tract infection. Patient was started on Rocephin. Urine culture ordered and results are pending. -She is improving clinically and symptomatically. Her white blood cell count is trending downward. She is afebrile.  Loose stools, query diarrhea. Patient apparently had 2 loose stools yesterday into this morning. C. difficile PCR has been ordered and is pending. Patient denies abdominal pain. We'll continue to monitor.  Hyperosmolar/nonketotic hyperglycemia. Patient is treated with Lantus and Januvia chronically. Patient's blood glucose was greater than 600. Her anion gap was within normal limits on admission, so she was apparently not in DKA. She was started on vigorous IV fluids and an insulin drip which was  quickly discontinued following improvement in her CBGs. She was subsequently started on sliding scale NovoLog and Lantus. -Her CBGs have improved. We'll continue sliding scale NovoLog and Lantus and titrate Lantus accordingly.-Hemoglobin A1c ordered and is pending.   Rhabdomyolysis. Patient was found down at home. Her CK was greater than 6000 on admission. Vigorous IV fluids with bicarbonate were started by the admitting physician. Follow-up CK is progressively improving and is currently 1087. -Bicarbonate discontinued in the IV fluids. We'll continue gentle IV fluids with normal saline.  Found down at home with acute encephalopathy. Patient is currently alert and oriented with no signs of encephalopathy or neurological changes. Her presenting encephalopathy was likely secondary to the UTI and hyperglycemia. -Her TSH was within normal limits at 1.2 and her vitamin B12 level was not deficient. -We'll order PT.  Hypertension with presenting Hypotension. Patient's blood pressure was within normal limits on admission, but has trended down. She likely has   hypovolemia and not sepsis. As above, she was started on vigorous IV fluids. Her blood pressure was marginal, but pressor support was not ordered. -With vigorous IV fluids, her blood pressure has improved. -We will restart valsartan at a lower dose and titrate up accordingly to prehospital dose of 320 g daily as needed.  Hyponatremia. Patient serum sodium was 129 on admission. With vigorous IV fluids, her serum sodium has normalized. Etiology was secondary to hypovolemia.  Acute kidney injury superimposed on probable stage II chronic kidney disease. Patient's creatinine 3 months ago was 1.27. It was 2.09 on admission. The increase was likely due to hypovolemia/prerenal azotemia and possibly  ATN. --Creatinine has improved with hydration. We'll continue to monitor.  Elevated LFTs. Patient has no abdominal pain on exam and has no history of  hepatitis. The elevated LFTs are likely from rhabdomyolysis.There are trending downward.  We'll continue to follow.  Perineal rash. Likely from candidal cellulitis. Nystatin powder and cream are ordered and will be continued. Barrier cream added per nursing.   DVT prophylaxis: Lovenox  Code Status: Full code Family Communication: Family not available. Disposition Plan: Discharge when clinically appropriate; patient may require SNF placement.   Consultants:   None  Procedures:  None  Antimicrobials:   Rocephin 03/23/16>>   Subjective: Patient complains of "diarrhea". Nursing reports 2 small loose stools yesterday into this morning. Patient denies abdominal pain.  Objective: Filed Vitals:   03/24/16 2300 03/25/16 0000 03/25/16 0012 03/25/16 0400  BP: 146/47 147/45    Pulse: 74 70    Temp:    99.2 F (37.3 C)  TempSrc:   Oral Oral  Resp: 17 18    Height:      Weight:    97.8 kg (215 lb 9.8 oz)  SpO2: 100% 100%      Intake/Output Summary (Last 24 hours) at 03/25/16 0754 Last data filed at 03/25/16 0527  Gross per 24 hour  Intake 2660.83 ml  Output    950 ml  Net 1710.83 ml   Filed Weights   03/23/16 2202 03/24/16 0500 03/25/16 0400  Weight: 94.6 kg (208 lb 8.9 oz) 94.6 kg (208 lb 8.9 oz) 97.8 kg (215 lb 9.8 oz)    Examination:  General exam: Appears calm and comfortable; elderly 82 year old woman in no acute distress.  Respiratory system: Clear anteriorly with decreased breath sounds in the bases. Respiratory effort normal. Cardiovascular system: S1 & S2 with 2/6 systolic murmur. No pedal edema. Gastrointestinal system: Abdomen is mildly obese nondistended, soft and nontender. No organomegaly or masses felt. Normal bowel sounds heard. Central nervous system: Alert and oriented. No focal neurological deficits. Extremities: Symmetric 5 x 5 power. Skin: Diffuse erythema around her perineum groin and buttocks (query candidal dermatitis/cellulitis). No open  bleeding or pustular drainage.  Psychiatry: Judgement and insight appear normal. Mood & affect appropriate. Speech is clear.     Data Reviewed: I have personally reviewed following labs and imaging studies  CBC:  Recent Labs Lab 03/23/16 1638 03/24/16 0849 03/25/16 0446  WBC 17.5* 13.4* 12.9*  NEUTROABS 16.2*  --   --   HGB 11.3* 11.0* 11.2*  HCT 33.0* 31.7* 33.6*  MCV 89.7 89.5 91.8  PLT 386 377 [id number]   Basic Metabolic Panel:  Recent Labs Lab 03/23/16 1638 03/24/16 0410 03/25/16 0446  NA 129* 136 137  K 4.9 3.8 4.1  CL 95* 102 106  CO2 21* 26 26  GLUCOSE 609* 152* 135*  BUN 63* 54* 44*  CREATININE 2.09* 1.50* 1.34*  CALCIUM 8.5* 7.9* 7.9*   GFR: Estimated Creatinine Clearance: 33.5 mL/min (by C-G formula based on Cr of 1.34). Liver Function Tests:  Recent Labs Lab 03/23/16 1638 03/24/16 0410 03/25/16 0446  AST 151* 149* 83*  ALT 81* 77* 71*  ALKPHOS 65 54 54  BILITOT 1.6* 0.9 0.4  PROT 6.2* 5.0* 5.1*  ALBUMIN 2.8* 2.3* 2.2*   No results for input(s): LIPASE, AMYLASE in the last 168 hours. No results for input(s): AMMONIA in the last 168 hours. Coagulation Profile: No results for input(s): INR, PROTIME in the last 168 hours. Cardiac Enzymes:  Recent Labs Lab 03/23/16 1638 03/24/16 [id number]  03/25/16 0446  CKTOTAL 6126* 3284* 1087*   BNP (last 3 results) No results for input(s): PROBNP in the last 8760 hours. HbA1C: No results for input(s): HGBA1C in the last 72 hours. CBG:  Recent Labs Lab 03/24/16 1116 03/24/16 1620 03/24/16 2006 03/25/16 0009 03/25/16 0416  GLUCAP 313* 330* 288* 211* 128*   Lipid Profile: No results for input(s): CHOL, HDL, LDLCALC, TRIG, CHOLHDL, LDLDIRECT in the last 72 hours. Thyroid Function Tests:  Recent Labs  03/24/16 0849  TSH 1.254   Anemia Panel:  Recent Labs  03/24/16 0849  VITAMINB12 3971*  TIBC 179*  IRON 24*   Sepsis Labs: No results for input(s): PROCALCITON, LATICACIDVEN in the last 168  hours.  Recent Results (from the past 240 hour(s))  MRSA PCR Screening     Status: None   Collection Time: 03/23/16 10:00 PM  Result Value Ref Range Status   MRSA by PCR NEGATIVE NEGATIVE Final    Comment:        The GeneXpert MRSA Assay (FDA approved for NASAL specimens only), is one component of a comprehensive MRSA colonization surveillance program. It is not intended to diagnose MRSA infection nor to guide or monitor treatment for MRSA infections.          Radiology Studies: Dg Chest 1 View  03/23/2016  CLINICAL DATA:  Fall today with left-sided pain with weakness. EXAM: CHEST 1 VIEW COMPARISON:  09/12/2013 FINDINGS: Lungs are hypoinflated without consolidation or effusion. No pneumothorax. Mild cardiomegaly. Mild calcified plaque over the aortic arch. There are degenerative changes of the spine. IMPRESSION: No acute cardiopulmonary disease. Mild stable cardiomegaly. Aortic atherosclerosis. Electronically Signed   By: Marin Olp M.D.   On: 03/23/2016 18:24   Dg Ankle Complete Left  03/23/2016  CLINICAL DATA:  Fall today with left ankle pain. EXAM: LEFT ANKLE COMPLETE - 3+ VIEW COMPARISON:  None. FINDINGS: There is diffuse decreased bone mineralization. Ankle mortise is within normal. There is no acute fracture or dislocation. Inferior calcaneal spur. IMPRESSION: No acute findings. Electronically Signed   By: Marin Olp M.D.   On: 03/23/2016 18:22   Ct Head Wo Contrast  03/23/2016  CLINICAL DATA:  Unable to ambulate 1 week. Covered in urine and stool. Two recent falls. EXAM: CT HEAD WITHOUT CONTRAST TECHNIQUE: Contiguous axial images were obtained from the base of the skull through the vertex without intravenous contrast. COMPARISON:  None. FINDINGS: Ventricles, cisterns and other CSF spaces are within normal. There is no mass, mass effect, shift of midline structures or acute hemorrhage. No evidence of acute infarction. Mild chronic ischemic microvascular disease. Bones and  soft tissues are within normal. IMPRESSION: No acute intracranial findings. Chronic ischemic microvascular disease. Electronically Signed   By: Marin Olp M.D.   On: 03/23/2016 17:54   Dg Knee Complete 4 Views Left  03/23/2016  CLINICAL DATA:  Fall today with left knee pain. EXAM: LEFT KNEE - COMPLETE 4+ VIEW COMPARISON:  None. FINDINGS: There is diffuse decreased bone mineralization. Severe joint space loss involving the medial compartment with mild osteoarthritic change of the lateral compartment and patellofemoral joints. No acute fracture or dislocation. No significant joint effusion. IMPRESSION: No acute fracture. Moderate osteoarthritic change. Electronically Signed   By: Marin Olp M.D.   On: 03/23/2016 18:18   Dg Knee Complete 4 Views Right  03/23/2016  CLINICAL DATA:  Fall today with pain on left side with weakness EXAM: RIGHT KNEE - COMPLETE 4+ VIEW COMPARISON:  None. FINDINGS: There is significant  degenerative change primarily involving the medial and patellofemoral compartments. No joint effusion or acute fracture. There is atherosclerotic calcification the popliteal artery. IMPRESSION: 1. Degenerative changes. 2.  No evidence for acute  abnormality. Electronically Signed   By: Nolon Nations M.D.   On: 03/23/2016 18:18   Dg Foot Complete Left  03/23/2016  CLINICAL DATA:  Fall today with left foot pain. EXAM: LEFT FOOT - COMPLETE 3+ VIEW COMPARISON:  None. FINDINGS: Diffuse decreased bone mineralization. Minimal degenerate change over the midfoot. No acute fracture or dislocation. IMPRESSION: No acute findings. Electronically Signed   By: Marin Olp M.D.   On: 03/23/2016 18:20   Dg Femur Min 2 Views Left  03/23/2016  CLINICAL DATA:  Fall today with left femur pain. EXAM: LEFT FEMUR 2 VIEWS COMPARISON:  None. FINDINGS: There is mild diffuse decreased bone mineralization. There is no acute fracture or dislocation. Degenerative changes of the knee. IMPRESSION: No acute findings.  Electronically Signed   By: Marin Olp M.D.   On: 03/23/2016 18:23        Scheduled Meds: . antiseptic oral rinse  7 mL Mouth Rinse BID  . aspirin  81 mg Oral Daily  . barrier cream  1 application Topical BID  . cefTRIAXone (ROCEPHIN)  IV  2 g Intravenous Q24H  . citalopram  20 mg Oral Daily  . enoxaparin (LOVENOX) injection  30 mg Subcutaneous Q24H  . insulin aspart  0-15 Units Subcutaneous TID WC  . insulin aspart  0-5 Units Subcutaneous QHS  . insulin glargine  10 Units Subcutaneous QHS  . nystatin   Topical BID  . nystatin cream  1 application Topical BID   Continuous Infusions: . 0.9 % NaCl with KCl 20 mEq / L 130 mL/hr (03/24/16 1633)     LOS: 2 days    Time spent: 36 minutes    Rexene Alberts, MD [facility] Pager [phone]  If 7PM-7AM, please contact night-coverage [email or web address] Password [email or web address] 03/25/2016, 7:54 AM

## 2016-03-25 NOTE — Evaluation (Signed)
Physical Therapy Evaluation Patient Details Name: Diana Colon MRN: [id number] DOB: 04/21/1929 Today's Date: 03/25/2016   History of Present Illness  82 yo F s/p fall on Thursdy 6/15 at a community watch activity & has not allowed dtr into the house since. She fell again on Sunday 6/18 & refused to go to the hospital. On 6/22 pt was found on the floor in her home covered in stool/urine. Dtr has notified APS and pt was brought to the hospital via EMS.  Head CT (-), XR L femur (-), XR L ankle & foot (-), XR B knees (-). Dx: Acute encephalopathy - UTI, dehydration, renal insufficiency, possible ATN from rhabdo and or volume depletion, uncontrolled DM. PMH: DM2, HTN, depression, DJD of the knee, OSA on CPAP, diabetic retinopathy, appendectomy  Clinical Impression  Pt received in bed, and was agreeable to PT evaluation.  Pt expressed that at baseline she uses a cane and a computer chair to ambulate in the house.  She is normally independent with ADL's.  During today's evaluation, she required Max A for transfer supine<>sit, and was not able to achieve standing during 3 trials of sit<>stand with Max A.  Pt lives alone and has had several falls recently, and therefore, she is not safe to return home at this time.  She is recommended for SNF to progress her strength, balance, endurance, and functional mobility.    Follow Up Recommendations SNF    Equipment Recommendations  None recommended by PT (Pt states she feels that she needs a w/c, however this will be determined at her next level of care)    Recommendations for Other Services       Precautions / Restrictions Precautions Precautions: Fall Precaution Comments: Pt has had at least 3 falls in the last week - she attributes these to tripping over things. However, it does not sound like she ambulates in a safe mannor at home.  Restrictions Weight Bearing Restrictions: No      Mobility  Bed Mobility Overal bed mobility: Needs  Assistance Bed Mobility: Supine to Sit;Sit to Supine     Supine to sit: Max assist;HOB elevated Sit to supine: Total assist   General bed mobility comments: Pt required increased time, and assistance to come to the EOB.  Bed pad used to scoot pt's hips to the EOB.  Pt states she is unable to scoot herself due to poor skin integrity in her peri-area.  Upon return to supine, pt requires assistance via bed pad to scoot hips back onto the bed, and then total A for sit<>stupine, and Max A for supine scoot with bed in [place].    Transfers                 General transfer comment: Attempted sit<>stand transfer 3x's with no success.  Unfortunately, pt is of short stature, with short arm length combined with generalized weakness, and despite Max A, she was not able to lift buttocks up off the bed.   Ambulation/Gait                Stairs            Wheelchair Mobility    Modified Rankin (Stroke Patients Only)       Balance Overall balance assessment: Needs assistance Sitting-balance support: Bilateral upper extremity supported Sitting balance-Leahy Scale: Fair Sitting balance - Comments: Pt expressed that she felt like she was going to tip forward on several occasions while sitting on the EOB.  However she  was fully upright.  This prevented her from shifting her weight anteriorly when attempting sit<>stand trials.                                      Pertinent Vitals/Pain Pain Assessment: 0-10 Pain Score: 5  Pain Location: L ankle Pain Descriptors / Indicators: Burning Pain Intervention(s): Limited activity within patient's tolerance;Monitored during session    Home Living   Living Arrangements: Alone Available Help at Discharge: Other (Comment) (pt states no one is aroune that can help, but she feels she needs help at home. ) Type of Home: House Home Access: Stairs to enter Entrance Stairs-Rails: Left Entrance Stairs-Number of Steps: 2 on  the front with HR on the left Home Layout: One level Home Equipment: Clinical cytogeneticist - standard      Prior Function Level of Independence: Independent with assistive device(s)   Gait / Transfers Assistance Needed: Pt states that she uses a cane and a computer chair for ambulation inside the house, and just the cane outside the house.    ADL's / Homemaking Assistance Needed: Pt states that she is able to dress herself, and she gets into the shower as much as she can, but she "bird baths" a lot.          Hand Dominance   Dominant Hand: Right    Extremity/Trunk Assessment   Upper Extremity Assessment: Generalized weakness           Lower Extremity Assessment: Generalized weakness         Communication   Communication: No difficulties  Cognition Arousal/Alertness: Awake/alert Behavior During Therapy: WFL for tasks assessed/performed Overall Cognitive Status: Within Functional Limits for tasks assessed                      General Comments      Exercises        Assessment/Plan    PT Assessment Patient needs continued PT services  PT Diagnosis Difficulty walking;Abnormality of gait;Generalized weakness   PT Problem List Decreased strength;Decreased range of motion;Decreased activity tolerance;Decreased balance;Decreased mobility;Decreased knowledge of use of DME;Decreased safety awareness;Decreased knowledge of precautions;Cardiopulmonary status limiting activity;Obesity;Decreased skin integrity;Pain  PT Treatment Interventions DME instruction;Gait training;Functional mobility training;Therapeutic activities;Therapeutic exercise;Balance training;Patient/family education   PT Goals (Current goals can be found in the Care Plan section) Acute Rehab PT Goals Patient Stated Goal: Pt wants to get stronger. PT Goal Formulation: With patient Time For Goal Achievement: 04/01/16 Potential to Achieve Goals: Good    Frequency Min 4X/week   Barriers to discharge  Decreased caregiver support Pt lives alone, and states she does not have anyone who can assist her at home.     Co-evaluation               End of Session Equipment Utilized During Treatment: Gait belt;Oxygen Activity Tolerance: Patient tolerated treatment well Patient left: in bed;with call bell/phone within reach;with nursing/sitter in room;with family/visitor present Nurse Communication: Mobility status (will need hoyer lift to get to the chair. )    Functional Assessment Tool Used: [place] "6-clicks"  Functional Limitation: Mobility: Walking and moving around Mobility: Walking and Moving Around Current Status [phone]): At least 80 percent but less than 100 percent impaired, limited or restricted Mobility: Walking and Moving Around Goal Status [phone]): At least 60 percent but less than 80 percent impaired, limited or restricted    Time:  [id number] PT Time Calculation (min) (ACUTE ONLY): 47 min   Charges:   PT Evaluation $PT Eval Moderate Complexity: 1 Procedure PT Treatments $Therapeutic Activity: 23-37 mins   PT G Codes:   PT G-Codes **NOT FOR INPATIENT CLASS** Functional Assessment Tool Used: [employer] "6-clicks"  Functional Limitation: Mobility: Walking and moving around Mobility: Walking and Moving Around Current Status [phone]): At least 80 percent but less than 100 percent impaired, limited or restricted Mobility: Walking and Moving Around Goal Status [phone]): At least 60 percent but less than 80 percent impaired, limited or restricted    Beth Alben Jepsen, PT, DPT X: [phone]

## 2016-03-26 LAB — GLUCOSE, CAPILLARY
GLUCOSE-CAPILLARY: 254 mg/dL — AB (ref 65–99)
Glucose-Capillary: 163 mg/dL — ABNORMAL HIGH (ref 65–99)
Glucose-Capillary: 284 mg/dL — ABNORMAL HIGH (ref 65–99)
Glucose-Capillary: 284 mg/dL — ABNORMAL HIGH (ref 65–99)

## 2016-03-26 LAB — CBC
HCT: 29.6 % — ABNORMAL LOW (ref 36.0–46.0)
Hemoglobin: 9.5 g/dL — ABNORMAL LOW (ref 12.0–15.0)
MCH: 30.4 pg (ref 26.0–34.0)
MCHC: 32.1 g/dL (ref 30.0–36.0)
MCV: 94.9 fL (ref 78.0–100.0)
PLATELETS: 273 10*3/uL (ref 150–400)
RBC: 3.12 MIL/uL — AB (ref 3.87–5.11)
RDW: 11.7 % (ref 11.5–15.5)
WBC: 9.5 10*3/uL (ref 4.0–10.5)

## 2016-03-26 LAB — COMPREHENSIVE METABOLIC PANEL
ALBUMIN: 1.9 g/dL — AB (ref 3.5–5.0)
ALT: 57 U/L — ABNORMAL HIGH (ref 14–54)
AST: 47 U/L — AB (ref 15–41)
Alkaline Phosphatase: 45 U/L (ref 38–126)
Anion gap: 4 — ABNORMAL LOW (ref 5–15)
BUN: 30 mg/dL — AB (ref 6–20)
CHLORIDE: 105 mmol/L (ref 101–111)
CO2: 26 mmol/L (ref 22–32)
Calcium: 8 mg/dL — ABNORMAL LOW (ref 8.9–10.3)
Creatinine, Ser: 1.11 mg/dL — ABNORMAL HIGH (ref 0.44–1.00)
GFR calc Af Amer: 51 mL/min — ABNORMAL LOW (ref >60)
GFR calc non Af Amer: 44 mL/min — ABNORMAL LOW (ref >60)
GLUCOSE: 193 mg/dL — AB (ref 65–99)
POTASSIUM: 4.3 mmol/L (ref 3.5–5.1)
Sodium: 135 mmol/L (ref 135–145)
Total Bilirubin: 0.5 mg/dL (ref 0.3–1.2)
Total Protein: 4.5 g/dL — ABNORMAL LOW (ref 6.5–8.1)

## 2016-03-26 LAB — CK: CK TOTAL: 459 U/L — AB (ref 38–234)

## 2016-03-26 MED ORDER — IRBESARTAN 300 MG PO TABS
300.0000 mg | ORAL_TABLET | Freq: Every day | ORAL | Status: DC
  Administered 2016-03-26 – 2016-03-28 (×3): 300 mg via ORAL
  Filled 2016-03-26 (×3): qty 1

## 2016-03-26 MED ORDER — FERROUS SULFATE 325 (65 FE) MG PO TABS
325.0000 mg | ORAL_TABLET | Freq: Every day | ORAL | Status: DC
  Administered 2016-03-27 – 2016-03-28 (×2): 325 mg via ORAL
  Filled 2016-03-26 (×2): qty 1

## 2016-03-26 MED ORDER — CLONIDINE HCL 0.1 MG PO TABS: 0.1000 mg | ORAL_TABLET | Freq: Once | ORAL | Status: DC

## 2016-03-26 MED ORDER — CLONIDINE HCL 0.1 MG PO TABS
0.1000 mg | ORAL_TABLET | Freq: Two times a day (BID) | ORAL | Status: DC | PRN
  Administered 2016-03-26 – 2016-03-27 (×2): 0.1 mg via ORAL
  Filled 2016-03-26 (×2): qty 1

## 2016-03-26 MED ORDER — ENOXAPARIN SODIUM 40 MG/0.4ML ~~LOC~~ SOLN
40.0000 mg | SUBCUTANEOUS | Status: DC
  Administered 2016-03-26 – 2016-03-27 (×2): 40 mg via SUBCUTANEOUS
  Filled 2016-03-26 (×2): qty 0.4

## 2016-03-26 MED ORDER — FUROSEMIDE 10 MG/ML IJ SOLN
20.0000 mg | Freq: Once | INTRAMUSCULAR | Status: AC
  Administered 2016-03-26: 20 mg via INTRAVENOUS
  Filled 2016-03-26: qty 2

## 2016-03-26 MED ORDER — HYDRALAZINE HCL 20 MG/ML IJ SOLN: 5.0000 mg | INTRAMUSCULAR | Status: DC | PRN

## 2016-03-26 NOTE — Progress Notes (Signed)
Pt refuse to wear CPAP tonight. Pt using 2lpm cann will continue to monitor through out the night

## 2016-03-26 NOTE — Progress Notes (Signed)
PROGRESS NOTE    Diana Colon  [id number] DOB: Mar 15, 1929 DOA: 03/23/2016 PCP: Odette Fraction, MD    Brief Narrative:  Patient is an 82 year old woman with a history of DM-2, OSA on CPAP, hypertension, and DJD, who presented to the ED on 03/23/16 after she was found down on her floor at home covered in stool and urine. Her daughter reported several falls several days prior to admission. In the ED, the patient was afebrile and hemodynamically stable. Radiographic studies revealed no acute fractures, but with DJD. CT scan of the head revealed no acute findings. Lab data were significant for a glucose of 609, sodium of 129, creatinine of 2.09, AST of 151/ALT of 81, total CK of 6126, and WBC of 17.5. Urinalysis revealed many bacteria. She was admitted for further evaluation and management of AMS, UTI and mild rhabdo.   Assessment & Plan:   Principal Problem:   Acute encephalopathy Active Problems:   UTI (urinary tract infection)   Hypotension   DM (diabetes mellitus), secondary, uncontrolled, w/renal complications (HCC)   Rhabdomyolysis   Acute renal insufficiency   Volume depletion   OSA on CPAP   Candida rash of groin   Elevated LFTs   Normocytic anemia   1. Urinary tract infection. Patient was started on Rocephin. Urine culture ordered in the ED. -She is improving clinically and symptomatically. Her white blood cell count is trending down and has normalized. She is afebrile. -Surprisingly, the urine culture was negative. Would favor antibiotic course of 5 days.  Loose stools, query diarrhea. Patient apparently had loose stools before the hospitalization and during the first 24 hours. C. difficile PCR was ordered and was negative. Patient denies abdominal pain. We'll continue to monitor.  Hyperosmolar/nonketotic hyperglycemia. Patient is treated with Lantus and Januvia chronically. Patient's blood glucose was greater than 600. Her anion gap was within normal limits on  admission, so she was apparently not in DKA. She was started on vigorous IV fluids and an insulin drip which was quickly discontinued following improvement in her CBGs. She was subsequently started on sliding scale NovoLog and Lantus. -Her CBGs have improved. We'll continue sliding scale NovoLog and Lantus and titrate Lantus accordingly.-Hemoglobin A1c ordered and is in process.   Rhabdomyolysis. Patient was found down at home. Her CK was greater than 6000 on admission. Vigorous IV fluids with bicarbonate were started by the admitting physician. Follow-up CK is progressively improving and is currently 459. -Bicarbonate was discontinued in the IV fluids. We'll decrease IV fluid rate.  Found down at home with acute encephalopathy. Patient is currently alert and oriented with no signs of encephalopathy or neurological changes. Her presenting encephalopathy was likely secondary to the UTI and hyperglycemia. -Her TSH was within normal limits at 1.2 and her vitamin B12 level was not deficient. -PT evaluated the patient and recommended SNF. The patient and family agree.  Hypertension with presenting Hypotension. Patient's blood pressure was within normal limits on admission, but has trended down. She likely has   hypovolemia and not sepsis. As above, she was started on vigorous IV fluids. Her blood pressure was marginal, but pressor support was not ordered. -With vigorous IV fluids, her blood pressure has improved. -Valsartan was restarted at a lower dose, but will be titrated up to prehospital dose of 320 mg.   Hyponatremia. Patient serum sodium was 129 on admission. With vigorous IV fluids, her serum sodium has normalized. Etiology was secondary to hypovolemia.  Acute kidney injury superimposed on probable stage II chronic  kidney disease. Patient's creatinine 3 months ago was 1.27. It was 2.09 on admission. The increase was likely due to hypovolemia/prerenal azotemia and possibly ATN. --Creatinine  has improved with hydration. We'll continue to monitor.  Elevated LFTs. Patient has no abdominal pain on exam and has no history of hepatitis. The elevated LFTs are likely from rhabdomyolysis.There are trending downward.  We'll continue to follow.  Normocytic anemia. Patient's hemoglobin was 11.0 on admission. It has gradually fallen to 9.5. Patient denies bright tarry stools or bright red blood per rectum. Anemia power reveals a total iron of 24, TIBC of 179, and elevated vitamin B12 level. Will start ferrous sulfate daily.  Perineal rash. Likely from candidal cellulitis. Nystatin powder and cream are ordered and will be continued. Barrier cream added per nursing.   DVT prophylaxis: Lovenox  Code Status: Full code Family Communication: Family not available. Disposition Plan: Discharge when clinically appropriate; patient may require SNF placement.   Consultants:   None  Procedures:  None  Antimicrobials:   Rocephin 03/23/16>>   Subjective: Patient denies diarrhea. She says that the coffee makes her have little stool. She denies pain with urination. She denies chest pain or shortness of breath.  Objective: Filed Vitals:   03/25/16 1456 03/25/16 2142 03/26/16 0400 03/26/16 0602  BP: 161/56 174/62  168/51  Pulse: 77 78  76  Temp: 98.2 F (36.8 C) 99 F (37.2 C)  98.9 F (37.2 C)  TempSrc: Oral Oral  Oral  Resp: 20 20  20   Height:      Weight:   98.7 kg (217 lb 9.5 oz)   SpO2: 100% 100%  98%    Intake/Output Summary (Last 24 hours) at 03/26/16 1225 Last data filed at 03/26/16 0400  Gross per 24 hour  Intake    600 ml  Output   1250 ml  Net   -650 ml   Filed Weights   03/24/16 0500 03/25/16 0400 03/26/16 0400  Weight: 94.6 kg (208 lb 8.9 oz) 97.8 kg (215 lb 9.8 oz) 98.7 kg (217 lb 9.5 oz)    Examination:  General exam: Alert elderly 82 year old woman in no acute distress.  Respiratory system: Clear anteriorly with decreased breath sounds in the bases.  Respiratory effort normal. Cardiovascular system: S1 & S2 with 2/6 systolic murmur. No pedal edema. Gastrointestinal system: Abdomen is mildly obese nondistended, soft and nontender. No organomegaly or masses felt. Normal bowel sounds heard. Central nervous system: Alert and oriented. No focal neurological deficits. Extremities: Symmetric 5 x 5 power. Skin: (Per exam on 03/25/16) Diffuse erythema around her perineum groin and buttocks (query candidal dermatitis/cellulitis). No open bleeding or pustular drainage.  Psychiatry: Judgement and insight appear normal. Mood & affect appropriate. Speech is clear.     Data Reviewed: I have personally reviewed following labs and imaging studies  CBC:  Recent Labs Lab 03/23/16 1638 03/24/16 0849 03/25/16 0446 03/26/16 0545  WBC 17.5* 13.4* 12.9* 9.5  NEUTROABS 16.2*  --   --   --   HGB 11.3* 11.0* 11.2* 9.5*  HCT 33.0* 31.7* 33.6* 29.6*  MCV 89.7 89.5 91.8 94.9  PLT 386 377 301 [id number]   Basic Metabolic Panel:  Recent Labs Lab 03/23/16 1638 03/24/16 0410 03/25/16 0446 03/26/16 0545  NA 129* 136 137 135  K 4.9 3.8 4.1 4.3  CL 95* 102 106 105  CO2 21* 26 26 26   GLUCOSE 609* 152* 135* 193*  BUN 63* 54* 44* 30*  CREATININE 2.09* 1.50* 1.34* 1.11*  CALCIUM 8.5* 7.9* 7.9* 8.0*   GFR: Estimated Creatinine Clearance: 40.7 mL/min (by C-G formula based on Cr of 1.11). Liver Function Tests:  Recent Labs Lab 03/23/16 1638 03/24/16 0410 03/25/16 0446 03/26/16 0545  AST 151* 149* 83* 47*  ALT 81* 77* 71* 57*  ALKPHOS 65 54 54 45  BILITOT 1.6* 0.9 0.4 0.5  PROT 6.2* 5.0* 5.1* 4.5*  ALBUMIN 2.8* 2.3* 2.2* 1.9*   No results for input(s): LIPASE, AMYLASE in the last 168 hours. No results for input(s): AMMONIA in the last 168 hours. Coagulation Profile: No results for input(s): INR, PROTIME in the last 168 hours. Cardiac Enzymes:  Recent Labs Lab 03/23/16 1638 03/24/16 0849 03/25/16 0446 03/26/16 0545  CKTOTAL 6126* 3284* 1087*  459*   BNP (last 3 results) No results for input(s): PROBNP in the last 8760 hours. HbA1C: No results for input(s): HGBA1C in the last 72 hours. CBG:  Recent Labs Lab 03/25/16 1219 03/25/16 1653 03/25/16 2145 03/26/16 0815 03/26/16 1159  GLUCAP 308* 352* 258* 163* 284*   Lipid Profile: No results for input(s): CHOL, HDL, LDLCALC, TRIG, CHOLHDL, LDLDIRECT in the last 72 hours. Thyroid Function Tests:  Recent Labs  03/24/16 0849  TSH 1.254   Anemia Panel:  Recent Labs  03/24/16 0849  VITAMINB12 3971*  TIBC 179*  IRON 24*   Sepsis Labs: No results for input(s): PROCALCITON, LATICACIDVEN in the last 168 hours.  Recent Results (from the past 240 hour(s))  Urine culture     Status: None   Collection Time: 03/23/16  4:30 PM  Result Value Ref Range Status   Specimen Description URINE, CATHETERIZED  Final   Special Requests NONE  Final   Culture NO GROWTH Performed at [facility]   Final   Report Status 03/25/2016 FINAL  Final  MRSA PCR Screening     Status: None   Collection Time: 03/23/16 10:00 PM  Result Value Ref Range Status   MRSA by PCR NEGATIVE NEGATIVE Final    Comment:        The GeneXpert MRSA Assay (FDA approved for NASAL specimens only), is one component of a comprehensive MRSA colonization surveillance program. It is not intended to diagnose MRSA infection nor to guide or monitor treatment for MRSA infections.   C difficile quick scan w PCR reflex     Status: None   Collection Time: 03/25/16  5:00 AM  Result Value Ref Range Status   C Diff antigen NEGATIVE NEGATIVE Final   C Diff toxin NEGATIVE NEGATIVE Final   C Diff interpretation Negative for toxigenic C. difficile  Final         Radiology Studies: No results found.      Scheduled Meds: . antiseptic oral rinse  7 mL Mouth Rinse BID  . aspirin  81 mg Oral Daily  . barrier cream  1 application Topical BID  . cefTRIAXone (ROCEPHIN)  IV  2 g Intravenous Q24H  .  citalopram  20 mg Oral Daily  . enoxaparin (LOVENOX) injection  40 mg Subcutaneous Q24H  . insulin aspart  0-15 Units Subcutaneous TID WC  . insulin aspart  0-5 Units Subcutaneous QHS  . insulin glargine  25 Units Subcutaneous QHS  . irbesartan  300 mg Oral Daily  . nystatin   Topical BID  . nystatin cream  1 application Topical BID   Continuous Infusions: . 0.9 % NaCl with KCl 20 mEq / L 50 mL/hr (03/25/16 0838)     LOS: 3 days  Time spent: 38 minutes    Rexene Alberts, MD [facility] Pager [phone]  If 7PM-7AM, please contact night-coverage [email or web address] Password [facility] 03/26/2016, 12:25 PM

## 2016-03-27 LAB — GLUCOSE, CAPILLARY
GLUCOSE-CAPILLARY: 345 mg/dL — AB (ref 65–99)
GLUCOSE-CAPILLARY: 249 mg/dL — AB (ref 65–99)
Glucose-Capillary: 280 mg/dL — ABNORMAL HIGH (ref 65–99)
Glucose-Capillary: 227 mg/dL — ABNORMAL HIGH (ref 65–99)

## 2016-03-27 LAB — BASIC METABOLIC PANEL
ANION GAP: 5 (ref 5–15)
BUN: 28 mg/dL — AB (ref 6–20)
CALCIUM: 7.9 mg/dL — AB (ref 8.9–10.3)
CO2: 28 mmol/L (ref 22–32)
Chloride: 99 mmol/L — ABNORMAL LOW (ref 101–111)
Creatinine, Ser: 1.19 mg/dL — ABNORMAL HIGH (ref 0.44–1.00)
GFR calc Af Amer: 47 mL/min — ABNORMAL LOW (ref >60)
GFR, EST NON AFRICAN AMERICAN: 40 mL/min — AB (ref >60)
GLUCOSE: 327 mg/dL — AB (ref 65–99)
POTASSIUM: 3.7 mmol/L (ref 3.5–5.1)
Sodium: 132 mmol/L — ABNORMAL LOW (ref 135–145)

## 2016-03-27 LAB — HEMOGLOBIN A1C
HEMOGLOBIN A1C: 9.8 % — AB (ref 4.8–5.6)
MEAN PLASMA GLUCOSE: 235 mg/dL

## 2016-03-27 MED ORDER — INSULIN NPH (HUMAN) (ISOPHANE) 100 UNIT/ML ~~LOC~~ SUSP
5.0000 [IU] | Freq: Once | SUBCUTANEOUS | Status: AC
  Administered 2016-03-27: 5 [IU] via SUBCUTANEOUS
  Filled 2016-03-27: qty 10

## 2016-03-27 MED ORDER — HYDRALAZINE HCL 10 MG PO TABS
20.0000 mg | ORAL_TABLET | Freq: Three times a day (TID) | ORAL | Status: DC
  Administered 2016-03-27 – 2016-03-28 (×5): 20 mg via ORAL
  Filled 2016-03-27 (×5): qty 2

## 2016-03-27 MED ORDER — INSULIN ASPART 100 UNIT/ML ~~LOC~~ SOLN
0.0000 [IU] | Freq: Three times a day (TID) | SUBCUTANEOUS | Status: DC
  Administered 2016-03-27 – 2016-03-28 (×2): 7 [IU] via SUBCUTANEOUS
  Administered 2016-03-28: 3 [IU] via SUBCUTANEOUS

## 2016-03-27 MED ORDER — INSULIN ASPART 100 UNIT/ML ~~LOC~~ SOLN: 0.0000 [IU] | Freq: Every day | SUBCUTANEOUS | Status: DC

## 2016-03-27 MED ORDER — INSULIN GLARGINE 100 UNIT/ML ~~LOC~~ SOLN
45.0000 [IU] | Freq: Every day | SUBCUTANEOUS | Status: DC
  Administered 2016-03-27: 45 [IU] via SUBCUTANEOUS
  Filled 2016-03-27 (×2): qty 0.45

## 2016-03-27 NOTE — Clinical Social Work Placement (Signed)
   CLINICAL SOCIAL WORK PLACEMENT  NOTE  Date:  03/27/2016  Patient Details  Name: Diana Colon MRN: [id number] Date of Birth: 07/26/1929  Clinical Social Work is seeking post-discharge placement for this patient at the [place] level of care (*CSW will initial, date and re-position this form in  chart as items are completed):  Yes   Patient/family provided with [place] Work Department's list of facilities offering this level of care within the geographic area requested by the patient (or if unable, by the patient's family).  Yes   Patient/family informed of their freedom to choose among providers that offer the needed level of care, that participate in Medicare, Medicaid or managed care program needed by the patient, have an available bed and are willing to accept the patient.  Yes   Patient/family informed of [place]'s ownership interest in [facility] and [facility], as well as of the fact that they are under no obligation to receive care at these facilities.  PASRR submitted to EDS on 03/24/16     PASRR number received on 03/24/16     Existing PASRR number confirmed on       FL2 transmitted to all facilities in geographic area requested by pt/family on 03/24/16     FL2 transmitted to all facilities within larger geographic area on       Patient informed that his/her managed care company has contracts with or will negotiate with certain facilities, including the following:        Yes   Patient/family informed of bed offers received.  Patient [facility] bed [facility]     Physician recommends and patient chooses bed at      Patient to be transferred to   on  .  Patient to be transferred to facility by       Patient family notified on   of transfer.  Name of family member notified:        PHYSICIAN       Additional Comment:    _______________________________________________ Ihor Gully, LCSW 03/27/2016,  12:34 PM

## 2016-03-27 NOTE — Care Management Important Message (Signed)
Important Message  Patient Details  Name: Diana Colon MRN: [id number] Date of Birth: 1928-12-30   Medicare Important Message Given:  Yes    Chimaobi Casebolt, Chauncey Reading, RN 03/27/2016, 9:36 AM

## 2016-03-27 NOTE — Progress Notes (Signed)
PROGRESS NOTE    Diana Colon  [id number] DOB: March 17, 1929 DOA: 03/23/2016 PCP: Odette Fraction, MD    Brief Narrative:  Patient is an 82 year old woman with a history of DM-2, OSA on CPAP, hypertension, and DJD, who presented to the ED on 03/23/16 after she was found down on her floor at home covered in stool and urine. Her daughter reported several falls several days prior to admission. In the ED, the patient was afebrile and hemodynamically stable. Radiographic studies revealed no acute fractures, but with DJD. CT scan of the head revealed no acute findings. Lab data were significant for a glucose of 609, sodium of 129, creatinine of 2.09, AST of 151/ALT of 81, total CK of 6126, and WBC of 17.5. Urinalysis revealed many bacteria. She was admitted for further evaluation and management of AMS, UTI and mild rhabdo.   Assessment & Plan:   Principal Problem:   Acute encephalopathy Active Problems:   UTI (urinary tract infection)   Hypotension   DM (diabetes mellitus), secondary, uncontrolled, w/renal complications (HCC)   Rhabdomyolysis   Acute renal insufficiency   Volume depletion   OSA on CPAP   Candida rash of groin   Elevated LFTs   Normocytic anemia   1. Urinary tract infection. Patient was started on Rocephin. Urine culture ordered in the ED. -She is improving clinically and symptomatically. Her white blood cell count is trending down and has normalized. She is afebrile. -Surprisingly, the urine culture was negative. Would favor antibiotic course of 5 days total.  Loose stools, query diarrhea. Patient apparently had loose stools before the hospitalization and during the first 24 hours. C. difficile PCR was ordered and was negative. Patient denies abdominal pain. She has few or stools. We'll continue to monitor.  Hyperosmolar/nonketotic hyperglycemia. Patient is treated with Lantus and Januvia chronically. Patient's blood glucose was greater than 600. Her anion gap was  within normal limits on admission, so she was apparently not in DKA. She was started on vigorous IV fluids and an insulin drip which was quickly discontinued following improvement in her CBGs. She was subsequently started on sliding scale NovoLog and Lantus. -Her CBGs have improved, but is trending up. -We'll increase Lantus to 45 units daily at bedtime and increase SSI to resisted scale. Give NPH 5 units 1. Continue to monitor. -A1c pending and is in process.   Rhabdomyolysis. Patient was found down at home. Her CK was greater than 6000 on admission. Vigorous IV fluids with bicarbonate were started by the admitting physician. Follow-up CK improved progressively. -Bicarbonate was discontinued in the IV fluids. IV fluids titrated down.  Found down at home with acute encephalopathy. Patient is currently alert and oriented with no signs of encephalopathy or neurological changes. Her presenting encephalopathy was likely secondary to the UTI and hyperglycemia. -Her TSH was within normal limits at 1.2 and her vitamin B12 level was not deficient. -PT evaluated the patient and recommended SNF. The patient and family agree.  Hypertension with presenting Hypotension. Patient's blood pressure was within normal limits on admission, but has trended down. She likely has   hypovolemia and not sepsis. As above, she was started on vigorous IV fluids. Her blood pressure was marginal, but pressor support was not ordered. -With vigorous IV fluids, her blood pressure  improved; then she became hypertensive.  -Valsartan was restarted at the home dose. She was given one IV 29 g dose of Lasix. Hydralazine 20 g every 8 hours started. -We'll continue to monitor for adjustments.  Hyponatremia. Patient serum sodium was 129 on admission. With vigorous IV fluids, her serum sodium did normalize. Etiology was secondary to hypovolemia. -Serum sodium slightly decreased following Lasix on 03/26/16. We'll continue to  monitor.  Acute kidney injury superimposed on probable stage II chronic kidney disease. Patient's creatinine 3 months ago was 1.27. It was 2.09 on admission. The increase was likely due to hypovolemia/prerenal azotemia and possibly ATN. --Creatinine has improved with hydration. We'll continue to monitor.  Elevated LFTs. Patient has no abdominal pain on exam and has no history of hepatitis. The elevated LFTs were likely from rhabdomyolysis.They trended downward.  Normocytic anemia. Patient's hemoglobin was 11.0 on admission. It has gradually fallen to 9.5. Patient denies bright tarry stools or bright red blood per rectum. Anemia panel reveals a total iron of 24, TIBC of 179, and elevated vitamin B12 level. Ferrous sulfate started daily.  Perineal rash. Likely from candidal cellulitis. Nystatin powder and cream are ordered and will be continued. Barrier cream added per nursing.   DVT prophylaxis: Lovenox  Code Status: Full code Family Communication: Family not available. Disposition Plan: Discharge when clinically appropriate to SNF, likely in the next 24 hours.   Consultants:   None  Procedures:  None  Antimicrobials:   Rocephin 03/23/16>>   Subjective: Patient denies diarrhea. She says doesn't feel as well this morning. She says the uncomfortable bed causes her not to sleep.  Objective: Filed Vitals:   03/26/16 2100 03/27/16 0500 03/27/16 1108 03/27/16 1312  BP: 217/66 188/57  186/49  Pulse: 72 65 74 69  Temp: 98.3 F (36.8 C) 98.6 F (37 C)  98 F (36.7 C)  TempSrc: Oral Oral  Oral  Resp: 18 18    Height:      Weight:  98.521 kg (217 lb 3.2 oz)    SpO2: 100% 100% 100% 100%    Intake/Output Summary (Last 24 hours) at 03/27/16 1325 Last data filed at 03/27/16 1115  Gross per 24 hour  Intake 301.17 ml  Output   5050 ml  Net -4748.83 ml   Filed Weights   03/25/16 0400 03/26/16 0400 03/27/16 0500  Weight: 97.8 kg (215 lb 9.8 oz) 98.7 kg (217 lb 9.5 oz) 98.521  kg (217 lb 3.2 oz)    Examination:  General exam: Alert elderly 82 year old woman in no acute distress.  Respiratory system: Clear anteriorly with decreased breath sounds in the bases. Respiratory effort normal. Cardiovascular system: S1 & S2 with 2/6 systolic murmur. No pedal edema. Gastrointestinal system: Abdomen is mildly obese nondistended, soft and nontender. No organomegaly or masses felt. Normal bowel sounds heard. Central nervous system: Alert and oriented. No focal neurological deficits. Extremities: Symmetric 5 x 5 power. Skin: (Per exam on 03/25/16) Diffuse erythema around her perineum groin and buttocks (query candidal dermatitis/cellulitis). No open bleeding or pustular drainage.  Psychiatry: Judgement and insight appear normal. Mood & affect appropriate. Speech is clear.     Data Reviewed: I have personally reviewed following labs and imaging studies  CBC:  Recent Labs Lab 03/23/16 1638 03/24/16 0849 03/25/16 0446 03/26/16 0545  WBC 17.5* 13.4* 12.9* 9.5  NEUTROABS 16.2*  --   --   --   HGB 11.3* 11.0* 11.2* 9.5*  HCT 33.0* 31.7* 33.6* 29.6*  MCV 89.7 89.5 91.8 94.9  PLT 386 377 301 [id number]   Basic Metabolic Panel:  Recent Labs Lab 03/23/16 1638 03/24/16 0410 03/25/16 0446 03/26/16 0545 03/27/16 1003  NA 129* 136 137 135 132*  K 4.9 3.8  4.1 4.3 3.7  CL 95* 102 106 105 99*  CO2 21* 26 26 26 28   GLUCOSE 609* 152* 135* 193* 327*  BUN 63* 54* 44* 30* 28*  CREATININE 2.09* 1.50* 1.34* 1.11* 1.19*  CALCIUM 8.5* 7.9* 7.9* 8.0* 7.9*   GFR: Estimated Creatinine Clearance: 37.9 mL/min (by C-G formula based on Cr of 1.19). Liver Function Tests:  Recent Labs Lab 03/23/16 1638 03/24/16 0410 03/25/16 0446 03/26/16 0545  AST 151* 149* 83* 47*  ALT 81* 77* 71* 57*  ALKPHOS 65 54 54 45  BILITOT 1.6* 0.9 0.4 0.5  PROT 6.2* 5.0* 5.1* 4.5*  ALBUMIN 2.8* 2.3* 2.2* 1.9*   No results for input(s): LIPASE, AMYLASE in the last 168 hours. No results for input(s):  AMMONIA in the last 168 hours. Coagulation Profile: No results for input(s): INR, PROTIME in the last 168 hours. Cardiac Enzymes:  Recent Labs Lab 03/23/16 1638 03/24/16 0849 03/25/16 0446 03/26/16 0545  CKTOTAL 6126* 3284* 1087* 459*   BNP (last 3 results) No results for input(s): PROBNP in the last 8760 hours. HbA1C: No results for input(s): HGBA1C in the last 72 hours. CBG:  Recent Labs Lab 03/26/16 1159 03/26/16 1621 03/26/16 2050 03/27/16 0757 03/27/16 1153  GLUCAP 284* 284* 254* 280* 345*   Lipid Profile: No results for input(s): CHOL, HDL, LDLCALC, TRIG, CHOLHDL, LDLDIRECT in the last 72 hours. Thyroid Function Tests: No results for input(s): TSH, T4TOTAL, FREET4, T3FREE, THYROIDAB in the last 72 hours. Anemia Panel: No results for input(s): VITAMINB12, FOLATE, FERRITIN, TIBC, IRON, RETICCTPCT in the last 72 hours. Sepsis Labs: No results for input(s): PROCALCITON, LATICACIDVEN in the last 168 hours.  Recent Results (from the past 240 hour(s))  Urine culture     Status: None   Collection Time: 03/23/16  4:30 PM  Result Value Ref Range Status   Specimen Description URINE, CATHETERIZED  Final   Special Requests NONE  Final   Culture NO GROWTH Performed at [facility]   Final   Report Status 03/25/2016 FINAL  Final  MRSA PCR Screening     Status: None   Collection Time: 03/23/16 10:00 PM  Result Value Ref Range Status   MRSA by PCR NEGATIVE NEGATIVE Final    Comment:        The GeneXpert MRSA Assay (FDA approved for NASAL specimens only), is one component of a comprehensive MRSA colonization surveillance program. It is not intended to diagnose MRSA infection nor to guide or monitor treatment for MRSA infections.   C difficile quick scan w PCR reflex     Status: None   Collection Time: 03/25/16  5:00 AM  Result Value Ref Range Status   C Diff antigen NEGATIVE NEGATIVE Final   C Diff toxin NEGATIVE NEGATIVE Final   C Diff interpretation  Negative for toxigenic C. difficile  Final         Radiology Studies: No results found.      Scheduled Meds: . antiseptic oral rinse  7 mL Mouth Rinse BID  . aspirin  81 mg Oral Daily  . barrier cream  1 application Topical BID  . cefTRIAXone (ROCEPHIN)  IV  2 g Intravenous Q24H  . citalopram  20 mg Oral Daily  . enoxaparin (LOVENOX) injection  40 mg Subcutaneous Q24H  . ferrous sulfate  325 mg Oral Q breakfast  . hydrALAZINE  20 mg Oral Q8H  . insulin aspart  0-15 Units Subcutaneous TID WC  . insulin aspart  0-5 Units Subcutaneous  QHS  . insulin glargine  25 Units Subcutaneous QHS  . irbesartan  300 mg Oral Daily  . nystatin   Topical BID  . nystatin cream  1 application Topical BID   Continuous Infusions: . 0.9 % NaCl with KCl 20 mEq / L 10 mL/hr at 03/26/16 1234     LOS: 4 days    Time spent: 25 minutes    Rexene Alberts, MD [facility] Pager [phone]  If 7PM-7AM, please contact night-coverage [email or web address] Password [email or web address] 03/27/2016, 1:25 PM

## 2016-03-27 NOTE — Progress Notes (Signed)
Inpatient Diabetes Program Recommendations  AACE/ADA: New Consensus Statement on Inpatient Glycemic Control (2015)  Target Ranges:  Prepandial:   less than 140 mg/dL      Peak postprandial:   less than 180 mg/dL (1-2 hours)      Critically ill patients:  140 - 180 mg/dL   Results for Diana Colon, Diana Colon (MRN [id number]) as of 03/27/2016 10:00  Ref. Range 03/26/2016 08:15 03/26/2016 11:59 03/26/2016 16:21 03/26/2016 20:50  Glucose-Capillary Latest Ref Range: 65-99 mg/dL 163 (H) 284 (H) 284 (H) 254 (H)   Results for Diana Colon, Diana Colon (MRN [id number]) as of 03/27/2016 10:00  Ref. Range 03/27/2016 07:57  Glucose-Capillary Latest Ref Range: 65-99 mg/dL 280 (H)    Admit AMS/ UTI/ Hyperglycemia.   History: DM  Home DM Meds: Lantus 50 units daily  Januvia 100 mg daily  Current Insulin Orders: Lantus 25 units QHS  Novolog Moderate Correction Scale/ SSI (0-15 units) TID AC + HS     -Patient eating 50-100% of meals.  -Having both elevated fasting and postprandial glucose levels.     MD- Please consider the following in-hospital insulin adjustments:  1. Increase Lantus to 35 units QHS (~75% home dose)  2. Start Novolog Meal Coverage- Novolog 4 units tid with meals (hold if pt eats <50% of meal)     --Will follow patient during hospitalization--  Wyn Quaker RN, MSN, CDE Diabetes Coordinator Inpatient Glycemic Control Team Team Pager: [phone] (8a-5p)

## 2016-03-27 NOTE — Progress Notes (Signed)
Physical Therapy Treatment Patient Details Name: Diana Colon MRN: [id number] DOB: [birth year] Today's Date: 03/27/2016    History of Present Illness 82 yo F s/p fall on Thursdy 6/15 at a community watch activity & has not allowed dtr into the house since. She fell again on Sunday 6/18 & refused to go to the hospital. On 6/22 pt was found on the floor in her home covered in stool/urine. Dtr has notified APS and pt was brought to the hospital via EMS.  Head CT (-), XR L femur (-), XR L ankle & foot (-), XR B knees (-). Dx: Acute encephalopathy - UTI, dehydration, renal insufficiency, possible ATN from rhabdo and or volume depletion, uncontrolled DM. PMH: DM2, HTN, depression, DJD of the knee, OSA on CPAP, diabetic retinopathy, appendectomy    PT Comments    Pt received in bed, dtr present, and pt is agreeable to PT tx.  Pt required Mod A for supine<>sit, as well as increased time to get to the EOB.  Pt also required extensive education on why it's important to use proper hand placement (with hands placed on what she is sitting on) to be able to perform the transfer.  Pt required max A for transfer bed<>chair, then chair<>BSC, and finally BSC<>chair.  Next tx will need to focus on proper hand placement when standing with RW.  Continue to recommend SNF due to extensive amount of assistance that she requires to complete functional mobility tasks at this time.    Follow Up Recommendations  SNF     Equipment Recommendations  None recommended by PT    Recommendations for Other Services       Precautions / Restrictions Precautions Precautions: Fall Precaution Comments: Pt has had at least 3 falls in the last week - she attributes these to tripping over things. However, it does not sound like she ambulates in a safe mannor at home.  Restrictions Weight Bearing Restrictions: No    Mobility  Bed Mobility Overal bed mobility: Needs Assistance Bed Mobility: Supine to Sit;Sit to Supine      Supine to sit: HOB elevated;Mod assist     General bed mobility comments: Pt required assistance to lift trunk up off the bed, and then increased time to scoot hips one at a time towards the EOB.    Transfers Overall transfer level: Needs assistance Equipment used: Rolling walker (2 wheeled) Transfers: Sit to/from [facility] Sit to Stand: Max assist Stand pivot transfers: Max assist       General transfer comment: Extensive education on proper sit<>stand technique with hand placement.  3 trials before pt able to perform sit<>stand transfer with rocking back and forth to gain momentum.  Pt was then able to transfer into the chair, however she does not demonstate good upright posture.  Instead she rests her forearms on the handles of the walker despite vc's to place her hands there.  Once in the chair, she expressed need to void, therefore she was assisted onto the [facility] & had a BM.  Assistance for hygiene.  Pt then transferred BSC<>chair with RW.    Ambulation/Gait                 Stairs            Wheelchair Mobility    Modified Rankin (Stroke Patients Only)       Balance   Sitting-balance support: Bilateral upper extremity supported Sitting balance-Leahy Scale: Fair     Standing balance support: Bilateral  upper extremity supported Standing balance-Leahy Scale: Poor                      Cognition Arousal/Alertness: Awake/alert Behavior During Therapy: WFL for tasks assessed/performed Overall Cognitive Status: Within Functional Limits for tasks assessed                      Exercises      General Comments        Pertinent Vitals/Pain Pain Assessment: 0-10 Pain Score: 5  Pain Location: L ankle Pain Descriptors / Indicators: Aching;Burning Pain Intervention(s): Limited activity within patient's tolerance    Home Living Family/patient expects to be discharged to:: Skilled nursing facility                     Prior Function            PT Goals (current goals can now be found in the care plan section) Acute Rehab PT Goals Patient Stated Goal: Pt wants to get stronger. PT Goal Formulation: With patient Time For Goal Achievement: 04/01/16 Potential to Achieve Goals: Good Progress towards PT goals: Progressing toward goals    Frequency  Min 4X/week    PT Plan      Co-evaluation             End of Session Equipment Utilized During Treatment: Gait belt;Oxygen Activity Tolerance: Patient tolerated treatment well Patient left: with call bell/phone within reach;with nursing/sitter in room;with family/visitor present;in chair     Time: [id number] PT Time Calculation (min) (ACUTE ONLY): 38 min  Charges:  $Therapeutic Activity: 38-52 mins                    G Codes:      Beth Larosa Rhines, PT, DPT X: [phone]

## 2016-03-27 NOTE — Progress Notes (Signed)
Patient requesting something for pain in left ankle.  States she cannot take codeine and requests something else besides Oxycodone.  Dr. Caryn Section notified via text page.

## 2016-03-27 NOTE — Clinical Social Work Note (Signed)
CSW spoke with patient's daughter and advised of current bed offers. They were accepting of [facility].         Glendon Fiser, Clydene Pugh, LCSW

## 2016-03-28 DIAGNOSIS — G9340 Encephalopathy, unspecified: Secondary | ICD-10-CM | POA: Diagnosis not present

## 2016-03-28 DIAGNOSIS — N390 Urinary tract infection, site not specified: Secondary | ICD-10-CM | POA: Diagnosis not present

## 2016-03-28 DIAGNOSIS — N289 Disorder of kidney and ureter, unspecified: Secondary | ICD-10-CM | POA: Diagnosis not present

## 2016-03-28 DIAGNOSIS — N183 Chronic kidney disease, stage 3 (moderate): Secondary | ICD-10-CM | POA: Diagnosis not present

## 2016-03-28 DIAGNOSIS — B3789 Other sites of candidiasis: Secondary | ICD-10-CM | POA: Diagnosis not present

## 2016-03-28 DIAGNOSIS — M6282 Rhabdomyolysis: Secondary | ICD-10-CM | POA: Diagnosis not present

## 2016-03-28 DIAGNOSIS — B379 Candidiasis, unspecified: Secondary | ICD-10-CM | POA: Insufficient documentation

## 2016-03-28 DIAGNOSIS — E860 Dehydration: Secondary | ICD-10-CM

## 2016-03-28 LAB — BASIC METABOLIC PANEL
Anion gap: 5 (ref 5–15)
BUN: 25 mg/dL — AB (ref 6–20)
CHLORIDE: 98 mmol/L — AB (ref 101–111)
CO2: 32 mmol/L (ref 22–32)
Calcium: 8.3 mg/dL — ABNORMAL LOW (ref 8.9–10.3)
Creatinine, Ser: 1.04 mg/dL — ABNORMAL HIGH (ref 0.44–1.00)
GFR calc Af Amer: 55 mL/min — ABNORMAL LOW (ref >60)
GFR calc non Af Amer: 48 mL/min — ABNORMAL LOW (ref >60)
GLUCOSE: 156 mg/dL — AB (ref 65–99)
POTASSIUM: 3.8 mmol/L (ref 3.5–5.1)
Sodium: 135 mmol/L (ref 135–145)

## 2016-03-28 LAB — GLUCOSE, CAPILLARY
GLUCOSE-CAPILLARY: 218 mg/dL — AB (ref 65–99)
Glucose-Capillary: 133 mg/dL — ABNORMAL HIGH (ref 65–99)

## 2016-03-28 MED ORDER — BARRIER CREAM NON-SPECIFIED: 1.0000 "application " | TOPICAL_CREAM | Freq: Two times a day (BID) | TOPICAL | Status: AC

## 2016-03-28 MED ORDER — NYSTATIN 100000 UNIT/GM EX CREA: 1.0000 "application " | TOPICAL_CREAM | Freq: Two times a day (BID) | CUTANEOUS | Status: DC

## 2016-03-28 MED ORDER — INSULIN ASPART 100 UNIT/ML ~~LOC~~ SOLN: 5.0000 [IU] | Freq: Three times a day (TID) | SUBCUTANEOUS | Status: DC

## 2016-03-28 MED ORDER — FERROUS SULFATE 325 (65 FE) MG PO TABS: 325.0000 mg | ORAL_TABLET | Freq: Every day | ORAL | Status: DC

## 2016-03-28 MED ORDER — HYDRALAZINE HCL 10 MG PO TABS
20.0000 mg | ORAL_TABLET | Freq: Three times a day (TID) | ORAL | Status: DC
Start: 2016-03-28 — End: 2017-07-31

## 2016-03-28 NOTE — Discharge Summary (Signed)
Physician Discharge Summary  Diana Colon:[id number] DOB: 02-Feb-1929 DOA: 03/23/2016  PCP: Odette Fraction, MD  Admit date: 03/23/2016 Discharge date: 03/28/2016  Time spent: Greater than 30 minutes  Recommendations for Outpatient Follow-up:  1. Patient is being discharged to SNF for reconditioning. 2. Recommend checking the patient's CBGs at least twice a day.   Discharge Diagnoses:  1. Urinary tract infection. 2. Hyperosmolar/nonketotic hyperglycemia with type 2 diabetes. 3. Found down at home with acute encephalopathy secondary to UTI and uncontrolled diabetes. -Syncope was not established. 4. Rhabdomyolysis. 5. Hypotension secondary to hypovolemia. 6. Long-standing hypertension. 7. Hyponatremia secondary to hypovolemia. 8. Acute kidney injury superimposed on stage II to stage III chronic kidney disease. 9. Elevated LFTs secondary to rhabdomyolysis. 10. Normocytic anemia secondary to iron deficiency. 11. Buttock and perineal candidal cellulitis. 12. Obesity. 13. OSA, noncompliant with CPAP.   Discharge Condition: Improved.  Diet recommendation: carotid modified/heart healthy.  Filed Weights   03/26/16 0400 03/27/16 0500 03/28/16 0619  Weight: 98.7 kg (217 lb 9.5 oz) 98.521 kg (217 lb 3.2 oz) 98.3 kg (216 lb 11.4 oz)    History of present illness:  Patient is an 82 year old woman with a history of DM-2, OSA on CPAP, hypertension, and DJD, who presented to the ED on 03/23/16 after she was found down on her floor at home covered in stool and urine. Her daughter reported several falls several days prior to admission. In the ED, the patient was afebrile and hemodynamically stable. Radiographic studies revealed no acute fractures, but with DJD. CT scan of the head revealed no acute findings. Lab data were significant for a glucose of 609, sodium of 129, creatinine of 2.09, AST of 151/ALT of 81, total CK of 6126, and WBC of 17.5. Urinalysis revealed many bacteria. She was  admitted for further evaluation and management of AMS, UTI and mild rhabdo.  Hospital Course:  1. Urinary tract infection. Patient was started on Rocephin. Urine culture ordered in the ED. Foley catheter was inserted. The urine culture did not grow any bacteria surprisingly. She improved clinically and symptomatically. Her white blood cell count decreased and then normalized. She remained afebrile. She completed a 6 day course of Rocephin. The Foley catheter was discontinued at the time of discharge.  Loose stools, query diarrhea. Patient apparently had loose stools before the hospitalization and during the first 24 hours. C. difficile PCR was ordered and was negative. Patient denied abdominal pain. Her loose stools subsided.  Hyperosmolar/nonketotic hyperglycemia. Patient is treated with Lantus and Januvia chronically. Patient's blood glucose was greater than 600. Her anion gap was within normal limits on admission, so she was apparently not in DKA. She was started on vigorous IV fluids and an insulin drip which was quickly discontinued following improvement in her CBGs. She was subsequently started on sliding scale NovoLog and Lantus. -Her CBGs have improved overall. Her hemoglobin A1c was 9.8 indicating poor outpatient control, possibly due to noncompliance. -She was discharged on Lantus, Januvia, and 3 times a day dosing of NovoLog.  Rhabdomyolysis. Patient was found down at home. Her CK was greater than 6000 on admission. Vigorous IV fluids with bicarbonate were started by the admitting physician. Follow-up CK improved progressively. -Bicarbonate was discontinued in the IV fluids and she was maintained on gentle IV fluids until discharge.  Found down at home with acute encephalopathy. Patient is currently alert and oriented with no signs of encephalopathy or neurological changes. There was no established syncope. Her presenting encephalopathy was likely secondary to the  UTI and  hyperglycemia. -Her TSH was within normal limits at 1.2 and her vitamin B12 level was not deficient. -PT evaluated the patient and recommended SNF. The patient and family agree.  Hypertension with presenting Hypotension. Patient's blood pressure was within normal limits on admission, but had trended down. The low blood pressure was from  hypovolemia and not sepsis. As above, she was started on vigorous IV fluids. Her blood pressure was marginal, but IV medication pressor support was not ordered. -With vigorous IV fluids, her blood pressure improved; then she became hypertensive.  -Valsartan was restarted at the home dose. She was given one IV 20 g dose of Lasix. Hydralazine 20 g every 8 hours started. -Lasix discontinued at the time of discharge. Hydralazine can be titrated accordingly.   Hyponatremia. Patient serum sodium was 129 on admission. With vigorous IV fluids, her serum sodium did normalize. Etiology was secondary to hypovolemia.  Acute kidney injury superimposed on probable stage II-III chronic kidney disease. Patient's creatinine 3 months ago was 1.27. It was 2.09 on admission. The increase was likely due to hypovolemia/prerenal azotemia and possibly ATN. --Creatinine has improved with hydration. It was 1.04 at the time of discharge.  Elevated LFTs. Patient had no abdominal pain on exam and has no history of hepatitis. The elevated LFTs were likely from rhabdomyolysis.They trended downward.  Normocytic anemia. Patient's hemoglobin was 11.0 on admission. It has gradually fallen to 9.5. Patient denies bright tarry stools or bright red blood per rectum. Anemia panel reveals a total iron of 24, TIBC of 179, and elevated vitamin B12 level. Ferrous sulfate started daily.  Perineal rash. Likely from candidal cellulitis. Nystatin powder and cream were  Ordered. Barrier cream added per nursing. She was discharged on nystatin cream and barrier cream. Foley catheter was  discontinued.  Procedures:  None   Consultations:  None  Discharge Exam: Filed Vitals:   03/27/16 2126 03/28/16 0619  BP: 167/62 156/72  Pulse: 66 58  Temp: 98.1 F (36.7 C) 98.1 F (36.7 C)  Resp: 18 18  Oxygen saturation 100% on room air.   General: Alert 82 year old woman in no acute distress. Cardiovascular: S1, S2, with a 2/6 systolic murmur. No pedal edema. Respiratory: clear to auscultation but decreased breath sounds in the bases. Neurologic: She is alert and oriented 3.  Discharge Instructions   Discharge Instructions    Diet - low sodium heart healthy    Complete by:  As directed      Diet Carb Modified    Complete by:  As directed      Increase activity slowly    Complete by:  As directed           Current Discharge Medication List    START taking these medications   Details  barrier cream (NON-SPECIFIED) CREA Apply 1 application topically 2 (two) times daily.    ferrous sulfate 325 (65 FE) MG tablet Take 1 tablet (325 mg total) by mouth daily with breakfast.    hydrALAZINE (APRESOLINE) 10 MG tablet Take 2 tablets (20 mg total) by mouth every 8 (eight) hours.    insulin aspart (NOVOLOG) 100 UNIT/ML injection Inject 5 Units into the skin 3 (three) times daily with meals. Qty: 10 mL, Refills: 11    nystatin cream (MYCOSTATIN) Apply 1 application topically 2 (two) times daily. Apply to buttocks and perineum      CONTINUE these medications which have NOT CHANGED   Details  aspirin EC 81 MG tablet Take 81 mg by  mouth daily.    Coenzyme Q10 (CO Q 10 PO) Take 1 tablet by mouth daily.    CORAL CALCIUM PO Take 1 tablet by mouth daily.    Cyanocobalamin (B-12 PO) Place 1 tablet under the tongue daily.    escitalopram (LEXAPRO) 10 MG tablet TAKE ONE TABLET BY MOUTH DAILY. Qty: 30 tablet, Refills: 5    Insulin Glargine (LANTUS SOLOSTAR) 100 UNIT/ML Solostar Pen INJECT 52 UNITS SUBCUTANEOUSLY AT BEDTIME. Qty: 45 mL, Refills: 2    omeprazole  (PRILOSEC) 40 MG capsule Take 1 capsule (40 mg total) by mouth daily. Qty: 30 capsule, Refills: 11   Associated Diagnoses: Gastroesophageal reflux disease without esophagitis    sitaGLIPtin (JANUVIA) 100 MG tablet Take 1 tablet (100 mg total) by mouth daily. Qty: 30 tablet, Refills: 2    valsartan (DIOVAN) 320 MG tablet Take 1 tablet (320 mg total) by mouth daily. Qty: 30 tablet, Refills: 0    blood glucose meter kit and supplies KIT Dispense based on patient and insurance preference. Use up to four times daily as directed. (FOR ICD-9 250.00, 250.01). Qty: 1 each, Refills: 0    glucose blood test strip 1 each by Other route 2 (two) times daily. Use as instructed Qty: 100 each, Refills: 11    Lancets 30G MISC 1 each by Does not apply route 2 (two) times daily. Qty: 100 each, Refills: 11       Allergies  Allergen Reactions  . Ace Inhibitors Swelling  . Codeine Nausea And Vomiting  . Metformin And Related Diarrhea   Follow-up Information    Follow up with [facility] .   Specialty:  [place] information:   [address] [place] [phone]       The results of significant diagnostics from this hospitalization (including imaging, microbiology, ancillary and laboratory) are listed below for reference.    Significant Diagnostic Studies: Dg Chest 1 View  03/23/2016  CLINICAL DATA:  Fall today with left-sided pain with weakness. EXAM: CHEST 1 VIEW COMPARISON:  09/12/2013 FINDINGS: Lungs are hypoinflated without consolidation or effusion. No pneumothorax. Mild cardiomegaly. Mild calcified plaque over the aortic arch. There are degenerative changes of the spine. IMPRESSION: No acute cardiopulmonary disease. Mild stable cardiomegaly. Aortic atherosclerosis. Electronically Signed   By: Marin Olp M.D.   On: 03/23/2016 18:24   Dg Ankle Complete Left  03/23/2016  CLINICAL DATA:  Fall today with left ankle pain. EXAM:  LEFT ANKLE COMPLETE - 3+ VIEW COMPARISON:  None. FINDINGS: There is diffuse decreased bone mineralization. Ankle mortise is within normal. There is no acute fracture or dislocation. Inferior calcaneal spur. IMPRESSION: No acute findings. Electronically Signed   By: Marin Olp M.D.   On: 03/23/2016 18:22   Ct Head Wo Contrast  03/23/2016  CLINICAL DATA:  Unable to ambulate 1 week. Covered in urine and stool. Two recent falls. EXAM: CT HEAD WITHOUT CONTRAST TECHNIQUE: Contiguous axial images were obtained from the base of the skull through the vertex without intravenous contrast. COMPARISON:  None. FINDINGS: Ventricles, cisterns and other CSF spaces are within normal. There is no mass, mass effect, shift of midline structures or acute hemorrhage. No evidence of acute infarction. Mild chronic ischemic microvascular disease. Bones and soft tissues are within normal. IMPRESSION: No acute intracranial findings. Chronic ischemic microvascular disease. Electronically Signed   By: Marin Olp M.D.   On: 03/23/2016 17:54   Dg Knee Complete 4 Views Left  03/23/2016  CLINICAL DATA:  Fall today with left knee pain. EXAM: LEFT KNEE - COMPLETE 4+ VIEW COMPARISON:  None. FINDINGS: There is diffuse decreased bone mineralization. Severe joint space loss involving the medial compartment with mild osteoarthritic change of the lateral compartment and patellofemoral joints. No acute fracture or dislocation. No significant joint effusion. IMPRESSION: No acute fracture. Moderate osteoarthritic change. Electronically Signed   By: Marin Olp M.D.   On: 03/23/2016 18:18   Dg Knee Complete 4 Views Right  03/23/2016  CLINICAL DATA:  Fall today with pain on left side with weakness EXAM: RIGHT KNEE - COMPLETE 4+ VIEW COMPARISON:  None. FINDINGS: There is significant degenerative change primarily involving the medial and patellofemoral compartments. No joint effusion or acute fracture. There is atherosclerotic calcification the  popliteal artery. IMPRESSION: 1. Degenerative changes. 2.  No evidence for acute  abnormality. Electronically Signed   By: Nolon Nations M.D.   On: 03/23/2016 18:18   Dg Foot Complete Left  03/23/2016  CLINICAL DATA:  Fall today with left foot pain. EXAM: LEFT FOOT - COMPLETE 3+ VIEW COMPARISON:  None. FINDINGS: Diffuse decreased bone mineralization. Minimal degenerate change over the midfoot. No acute fracture or dislocation. IMPRESSION: No acute findings. Electronically Signed   By: Marin Olp M.D.   On: 03/23/2016 18:20   Dg Femur Min 2 Views Left  03/23/2016  CLINICAL DATA:  Fall today with left femur pain. EXAM: LEFT FEMUR 2 VIEWS COMPARISON:  None. FINDINGS: There is mild diffuse decreased bone mineralization. There is no acute fracture or dislocation. Degenerative changes of the knee. IMPRESSION: No acute findings. Electronically Signed   By: Marin Olp M.D.   On: 03/23/2016 18:23    Microbiology: Recent Results (from the past 240 hour(s))  Urine culture     Status: None   Collection Time: 03/23/16  4:30 PM  Result Value Ref Range Status   Specimen Description URINE, CATHETERIZED  Final   Special Requests NONE  Final   Culture NO GROWTH Performed at [facility]   Final   Report Status 03/25/2016 FINAL  Final  MRSA PCR Screening     Status: None   Collection Time: 03/23/16 10:00 PM  Result Value Ref Range Status   MRSA by PCR NEGATIVE NEGATIVE Final    Comment:        The GeneXpert MRSA Assay (FDA approved for NASAL specimens only), is one component of a comprehensive MRSA colonization surveillance program. It is not intended to diagnose MRSA infection nor to guide or monitor treatment for MRSA infections.   C difficile quick scan w PCR reflex     Status: None   Collection Time: 03/25/16  5:00 AM  Result Value Ref Range Status   C Diff antigen NEGATIVE NEGATIVE Final   C Diff toxin NEGATIVE NEGATIVE Final   C Diff interpretation Negative for toxigenic C.  difficile  Final     Labs: Basic Metabolic Panel:  Recent Labs Lab 03/24/16 0410 03/25/16 0446 03/26/16 0545 03/27/16 1003 03/28/16 0541  NA 136 137 135 132* 135  K 3.8 4.1 4.3 3.7 3.8  CL 102 106 105 99* 98*  CO2 '26 26 26 28 '$ 32  GLUCOSE 152* 135* 193* 327* 156*  BUN 54* 44* 30* 28* 25*  CREATININE 1.50* 1.34* 1.11* 1.19* 1.04*  CALCIUM 7.9* 7.9* 8.0* 7.9* 8.3*   Liver Function Tests:  Recent Labs Lab 03/23/16 1638 03/24/16 0410 03/25/16 0446 03/26/16 0545  AST 151* 149* 83* 47*  ALT 81* 77* 71* 57*  ALKPHOS 65 54 54 45  BILITOT 1.6* 0.9 0.4 0.5  PROT 6.2* 5.0* 5.1* 4.5*  ALBUMIN 2.8* 2.3* 2.2* 1.9*   No results for input(s): LIPASE, AMYLASE in the last 168 hours. No results for input(s): AMMONIA in the last 168 hours. CBC:  Recent Labs Lab 03/23/16 1638 03/24/16 0849 03/25/16 0446 03/26/16 0545  WBC 17.5* 13.4* 12.9* 9.5  NEUTROABS 16.2*  --   --   --   HGB 11.3* 11.0* 11.2* 9.5*  HCT 33.0* 31.7* 33.6* 29.6*  MCV 89.7 89.5 91.8 94.9  PLT 386 377 301 273   Cardiac Enzymes:  Recent Labs Lab 03/23/16 1638 03/24/16 0849 03/25/16 0446 03/26/16 0545  CKTOTAL 6126* 3284* 1087* 459*   BNP: BNP (last 3 results) No results for input(s): BNP in the last 8760 hours.  ProBNP (last 3 results) No results for input(s): PROBNP in the last 8760 hours.  CBG:  Recent Labs Lab 03/27/16 1153 03/27/16 1659 03/27/16 2030 03/28/16 0747 03/28/16 1146  GLUCAP 345* 227* 249* 133* 218*       Signed:  Dreux Mcgroarty MD.  [facility] 03/28/2016, 1:38 PM

## 2016-03-28 NOTE — Clinical Social Work Placement (Signed)
   CLINICAL SOCIAL WORK PLACEMENT  NOTE  Date:  03/28/2016  Patient Details  Name: Diana Colon MRN: [id number] Date of Birth: 1928-12-19  Clinical Social Work is seeking post-discharge placement for this patient at the [place] level of care (*CSW will initial, date and re-position this form in  chart as items are completed):  Yes   Patient/family provided with [place] Work Department's list of facilities offering this level of care within the geographic area requested by the patient (or if unable, by the patient's family).  Yes   Patient/family informed of their freedom to choose among providers that offer the needed level of care, that participate in Medicare, Medicaid or managed care program needed by the patient, have an available bed and are willing to accept the patient.  Yes   Patient/family informed of [place]'s ownership interest in [facility] and [facility], as well as of the fact that they are under no obligation to receive care at these facilities.  PASRR submitted to EDS on 03/24/16     PASRR number received on 03/24/16     Existing PASRR number confirmed on       FL2 transmitted to all facilities in geographic area requested by pt/family on 03/24/16     FL2 transmitted to all facilities within larger geographic area on       Patient informed that his/her managed care company has contracts with or will negotiate with certain facilities, including the following:        Yes   Patient/family informed of bed offers received.  Patient chooses bed at [facility]     Physician recommends and patient chooses bed at      Patient to be transferred to [facility] on 03/28/16.  Patient to be transferred to facility by [facility]     Patient family notified on 03/28/16 of transfer.  Name of family member notified:  [facility]     PHYSICIAN       Additional Comment:     _______________________________________________ Ihor Gully, LCSW 03/28/2016, 2:47 PM

## 2016-03-28 NOTE — Progress Notes (Signed)
Pt d/c to [facility] with [facility] via stretcher, foley removed, iv removed and belongings with pt. Report called to [facility] receiving nurse.

## 2016-03-28 NOTE — Clinical Social Work Note (Signed)
CSW spoke with Cleon Dew at [facility] and advised that patient was discharging today and would be transported to the facility. CSW obtained authorization information from [facility] and provided it to facility.  CSW spoke to patient's daugter, [facility], and advised that patient was discharging today and would be transported to [facility] via [facility] due to need for continuous oxygen.  CSW signing off.  Neida Ellegood, Clydene Pugh, LCSW

## 2016-03-28 NOTE — Care Management Note (Signed)
Case Management Note  Patient Details  Name: MACKENZY TWADDLE MRN: [id number] Date of Birth: [birth year]    Expected Discharge Date:  03/27/16               Expected Discharge Plan:  [place]  In-House Referral:  Clinical Social Work  Discharge planning Services  CM Consult  Post Acute Care Choice:  NA Choice offered to:  NA  DME Arranged:    DME Agency:     HH Arranged:    [place] Agency:     Status of Service:  Completed, signed off  If discussed at [employer], dates discussed:    Additional Comments: Patient d/c today to [facility] in [place]. CSW aware and making arrangements.  Karalina Tift, Chauncey Reading, RN 03/28/2016, 11:43 AM

## 2016-03-28 NOTE — Consult Note (Signed)
   [facility] CM Inpatient Consult   03/28/2016  Diana Colon 12/20/1928 [id number]  Patient screened for potential [place] Management services. Patient is eligible for [place]. Electronic medical record reveals patient's discharge plan is go to [place], there were no identifiable [facility] care management needs at this time. [facility] Care Management services not appropriate at this time. If patient's post hospital needs change please place a [facility] Care Management consult.  For questions please contact:   Royetta Crochet. Laymond Purser, RN, BSN, [place] [phone]) Business Cell  [phone]) Toll Free Office

## 2016-04-03 ENCOUNTER — Ambulatory Visit: Payer: Commercial Managed Care - HMO | Admitting: Family Medicine

## 2016-10-03 DIAGNOSIS — F39 Unspecified mood [affective] disorder: Secondary | ICD-10-CM | POA: Diagnosis not present

## 2016-10-03 DIAGNOSIS — R4189 Other symptoms and signs involving cognitive functions and awareness: Secondary | ICD-10-CM | POA: Diagnosis not present

## 2016-10-03 DIAGNOSIS — F419 Anxiety disorder, unspecified: Secondary | ICD-10-CM | POA: Diagnosis not present

## 2016-10-03 DIAGNOSIS — F329 Major depressive disorder, single episode, unspecified: Secondary | ICD-10-CM | POA: Diagnosis not present

## 2016-10-12 DIAGNOSIS — G4739 Other sleep apnea: Secondary | ICD-10-CM | POA: Diagnosis not present

## 2016-10-12 DIAGNOSIS — I10 Essential (primary) hypertension: Secondary | ICD-10-CM | POA: Diagnosis not present

## 2016-10-12 DIAGNOSIS — N178 Other acute kidney failure: Secondary | ICD-10-CM | POA: Diagnosis not present

## 2016-10-12 DIAGNOSIS — G9349 Other encephalopathy: Secondary | ICD-10-CM | POA: Diagnosis not present

## 2016-10-12 DIAGNOSIS — E119 Type 2 diabetes mellitus without complications: Secondary | ICD-10-CM | POA: Diagnosis not present

## 2016-10-16 DIAGNOSIS — F39 Unspecified mood [affective] disorder: Secondary | ICD-10-CM | POA: Diagnosis not present

## 2016-10-16 DIAGNOSIS — R4189 Other symptoms and signs involving cognitive functions and awareness: Secondary | ICD-10-CM | POA: Diagnosis not present

## 2016-10-16 DIAGNOSIS — F419 Anxiety disorder, unspecified: Secondary | ICD-10-CM | POA: Diagnosis not present

## 2016-10-16 DIAGNOSIS — F329 Major depressive disorder, single episode, unspecified: Secondary | ICD-10-CM | POA: Diagnosis not present

## 2016-10-20 DIAGNOSIS — I10 Essential (primary) hypertension: Secondary | ICD-10-CM | POA: Diagnosis not present

## 2016-10-20 DIAGNOSIS — E785 Hyperlipidemia, unspecified: Secondary | ICD-10-CM | POA: Diagnosis not present

## 2016-10-20 DIAGNOSIS — E119 Type 2 diabetes mellitus without complications: Secondary | ICD-10-CM | POA: Diagnosis not present

## 2016-11-07 DIAGNOSIS — E1151 Type 2 diabetes mellitus with diabetic peripheral angiopathy without gangrene: Secondary | ICD-10-CM | POA: Diagnosis not present

## 2016-11-13 DIAGNOSIS — Z794 Long term (current) use of insulin: Secondary | ICD-10-CM | POA: Diagnosis not present

## 2016-11-13 DIAGNOSIS — E113293 Type 2 diabetes mellitus with mild nonproliferative diabetic retinopathy without macular edema, bilateral: Secondary | ICD-10-CM | POA: Diagnosis not present

## 2016-11-13 DIAGNOSIS — Z961 Presence of intraocular lens: Secondary | ICD-10-CM | POA: Diagnosis not present

## 2016-11-13 DIAGNOSIS — Z7984 Long term (current) use of oral hypoglycemic drugs: Secondary | ICD-10-CM | POA: Diagnosis not present

## 2016-11-14 DIAGNOSIS — M6281 Muscle weakness (generalized): Secondary | ICD-10-CM | POA: Diagnosis not present

## 2016-11-14 DIAGNOSIS — R278 Other lack of coordination: Secondary | ICD-10-CM | POA: Diagnosis not present

## 2016-11-14 DIAGNOSIS — R4189 Other symptoms and signs involving cognitive functions and awareness: Secondary | ICD-10-CM | POA: Diagnosis not present

## 2016-11-14 DIAGNOSIS — R279 Unspecified lack of coordination: Secondary | ICD-10-CM | POA: Diagnosis not present

## 2016-11-14 DIAGNOSIS — R262 Difficulty in walking, not elsewhere classified: Secondary | ICD-10-CM | POA: Diagnosis not present

## 2016-11-14 DIAGNOSIS — N390 Urinary tract infection, site not specified: Secondary | ICD-10-CM | POA: Diagnosis not present

## 2016-11-14 DIAGNOSIS — R1312 Dysphagia, oropharyngeal phase: Secondary | ICD-10-CM | POA: Diagnosis not present

## 2016-11-15 DIAGNOSIS — D519 Vitamin B12 deficiency anemia, unspecified: Secondary | ICD-10-CM | POA: Diagnosis not present

## 2016-11-15 DIAGNOSIS — R279 Unspecified lack of coordination: Secondary | ICD-10-CM | POA: Diagnosis not present

## 2016-11-15 DIAGNOSIS — M6281 Muscle weakness (generalized): Secondary | ICD-10-CM | POA: Diagnosis not present

## 2016-11-15 DIAGNOSIS — N390 Urinary tract infection, site not specified: Secondary | ICD-10-CM | POA: Diagnosis not present

## 2016-11-15 DIAGNOSIS — R262 Difficulty in walking, not elsewhere classified: Secondary | ICD-10-CM | POA: Diagnosis not present

## 2016-11-15 DIAGNOSIS — R278 Other lack of coordination: Secondary | ICD-10-CM | POA: Diagnosis not present

## 2016-11-15 DIAGNOSIS — R1312 Dysphagia, oropharyngeal phase: Secondary | ICD-10-CM | POA: Diagnosis not present

## 2016-11-16 DIAGNOSIS — R1312 Dysphagia, oropharyngeal phase: Secondary | ICD-10-CM | POA: Diagnosis not present

## 2016-11-16 DIAGNOSIS — N390 Urinary tract infection, site not specified: Secondary | ICD-10-CM | POA: Diagnosis not present

## 2016-11-16 DIAGNOSIS — M6281 Muscle weakness (generalized): Secondary | ICD-10-CM | POA: Diagnosis not present

## 2016-11-16 DIAGNOSIS — R278 Other lack of coordination: Secondary | ICD-10-CM | POA: Diagnosis not present

## 2016-11-16 DIAGNOSIS — R279 Unspecified lack of coordination: Secondary | ICD-10-CM | POA: Diagnosis not present

## 2016-11-16 DIAGNOSIS — R262 Difficulty in walking, not elsewhere classified: Secondary | ICD-10-CM | POA: Diagnosis not present

## 2016-11-17 DIAGNOSIS — R278 Other lack of coordination: Secondary | ICD-10-CM | POA: Diagnosis not present

## 2016-11-17 DIAGNOSIS — R279 Unspecified lack of coordination: Secondary | ICD-10-CM | POA: Diagnosis not present

## 2016-11-17 DIAGNOSIS — R1312 Dysphagia, oropharyngeal phase: Secondary | ICD-10-CM | POA: Diagnosis not present

## 2016-11-17 DIAGNOSIS — R262 Difficulty in walking, not elsewhere classified: Secondary | ICD-10-CM | POA: Diagnosis not present

## 2016-11-17 DIAGNOSIS — N390 Urinary tract infection, site not specified: Secondary | ICD-10-CM | POA: Diagnosis not present

## 2016-11-17 DIAGNOSIS — M6281 Muscle weakness (generalized): Secondary | ICD-10-CM | POA: Diagnosis not present

## 2016-11-20 DIAGNOSIS — M6281 Muscle weakness (generalized): Secondary | ICD-10-CM | POA: Diagnosis not present

## 2016-11-20 DIAGNOSIS — R279 Unspecified lack of coordination: Secondary | ICD-10-CM | POA: Diagnosis not present

## 2016-11-20 DIAGNOSIS — R1312 Dysphagia, oropharyngeal phase: Secondary | ICD-10-CM | POA: Diagnosis not present

## 2016-11-20 DIAGNOSIS — R262 Difficulty in walking, not elsewhere classified: Secondary | ICD-10-CM | POA: Diagnosis not present

## 2016-11-20 DIAGNOSIS — R278 Other lack of coordination: Secondary | ICD-10-CM | POA: Diagnosis not present

## 2016-11-20 DIAGNOSIS — N390 Urinary tract infection, site not specified: Secondary | ICD-10-CM | POA: Diagnosis not present

## 2016-11-21 DIAGNOSIS — R262 Difficulty in walking, not elsewhere classified: Secondary | ICD-10-CM | POA: Diagnosis not present

## 2016-11-21 DIAGNOSIS — N390 Urinary tract infection, site not specified: Secondary | ICD-10-CM | POA: Diagnosis not present

## 2016-11-21 DIAGNOSIS — R1312 Dysphagia, oropharyngeal phase: Secondary | ICD-10-CM | POA: Diagnosis not present

## 2016-11-21 DIAGNOSIS — R278 Other lack of coordination: Secondary | ICD-10-CM | POA: Diagnosis not present

## 2016-11-21 DIAGNOSIS — R279 Unspecified lack of coordination: Secondary | ICD-10-CM | POA: Diagnosis not present

## 2016-11-21 DIAGNOSIS — M6281 Muscle weakness (generalized): Secondary | ICD-10-CM | POA: Diagnosis not present

## 2016-11-22 DIAGNOSIS — R262 Difficulty in walking, not elsewhere classified: Secondary | ICD-10-CM | POA: Diagnosis not present

## 2016-11-22 DIAGNOSIS — R1312 Dysphagia, oropharyngeal phase: Secondary | ICD-10-CM | POA: Diagnosis not present

## 2016-11-22 DIAGNOSIS — R278 Other lack of coordination: Secondary | ICD-10-CM | POA: Diagnosis not present

## 2016-11-22 DIAGNOSIS — M6281 Muscle weakness (generalized): Secondary | ICD-10-CM | POA: Diagnosis not present

## 2016-11-22 DIAGNOSIS — R279 Unspecified lack of coordination: Secondary | ICD-10-CM | POA: Diagnosis not present

## 2016-11-22 DIAGNOSIS — N390 Urinary tract infection, site not specified: Secondary | ICD-10-CM | POA: Diagnosis not present

## 2016-11-23 DIAGNOSIS — R278 Other lack of coordination: Secondary | ICD-10-CM | POA: Diagnosis not present

## 2016-11-23 DIAGNOSIS — R279 Unspecified lack of coordination: Secondary | ICD-10-CM | POA: Diagnosis not present

## 2016-11-23 DIAGNOSIS — N390 Urinary tract infection, site not specified: Secondary | ICD-10-CM | POA: Diagnosis not present

## 2016-11-23 DIAGNOSIS — R262 Difficulty in walking, not elsewhere classified: Secondary | ICD-10-CM | POA: Diagnosis not present

## 2016-11-23 DIAGNOSIS — M6281 Muscle weakness (generalized): Secondary | ICD-10-CM | POA: Diagnosis not present

## 2016-11-23 DIAGNOSIS — R1312 Dysphagia, oropharyngeal phase: Secondary | ICD-10-CM | POA: Diagnosis not present

## 2016-11-24 DIAGNOSIS — R279 Unspecified lack of coordination: Secondary | ICD-10-CM | POA: Diagnosis not present

## 2016-11-24 DIAGNOSIS — R278 Other lack of coordination: Secondary | ICD-10-CM | POA: Diagnosis not present

## 2016-11-24 DIAGNOSIS — R262 Difficulty in walking, not elsewhere classified: Secondary | ICD-10-CM | POA: Diagnosis not present

## 2016-11-24 DIAGNOSIS — M6281 Muscle weakness (generalized): Secondary | ICD-10-CM | POA: Diagnosis not present

## 2016-11-24 DIAGNOSIS — N390 Urinary tract infection, site not specified: Secondary | ICD-10-CM | POA: Diagnosis not present

## 2016-11-24 DIAGNOSIS — R1312 Dysphagia, oropharyngeal phase: Secondary | ICD-10-CM | POA: Diagnosis not present

## 2016-11-27 DIAGNOSIS — G9349 Other encephalopathy: Secondary | ICD-10-CM | POA: Diagnosis not present

## 2016-11-27 DIAGNOSIS — G4739 Other sleep apnea: Secondary | ICD-10-CM | POA: Diagnosis not present

## 2016-11-27 DIAGNOSIS — M6281 Muscle weakness (generalized): Secondary | ICD-10-CM | POA: Diagnosis not present

## 2016-11-27 DIAGNOSIS — R262 Difficulty in walking, not elsewhere classified: Secondary | ICD-10-CM | POA: Diagnosis not present

## 2016-11-27 DIAGNOSIS — R1312 Dysphagia, oropharyngeal phase: Secondary | ICD-10-CM | POA: Diagnosis not present

## 2016-11-27 DIAGNOSIS — N390 Urinary tract infection, site not specified: Secondary | ICD-10-CM | POA: Diagnosis not present

## 2016-11-27 DIAGNOSIS — N178 Other acute kidney failure: Secondary | ICD-10-CM | POA: Diagnosis not present

## 2016-11-27 DIAGNOSIS — R279 Unspecified lack of coordination: Secondary | ICD-10-CM | POA: Diagnosis not present

## 2016-11-27 DIAGNOSIS — R278 Other lack of coordination: Secondary | ICD-10-CM | POA: Diagnosis not present

## 2016-11-27 DIAGNOSIS — E119 Type 2 diabetes mellitus without complications: Secondary | ICD-10-CM | POA: Diagnosis not present

## 2016-11-27 DIAGNOSIS — I10 Essential (primary) hypertension: Secondary | ICD-10-CM | POA: Diagnosis not present

## 2016-11-28 DIAGNOSIS — R278 Other lack of coordination: Secondary | ICD-10-CM | POA: Diagnosis not present

## 2016-11-28 DIAGNOSIS — N390 Urinary tract infection, site not specified: Secondary | ICD-10-CM | POA: Diagnosis not present

## 2016-11-28 DIAGNOSIS — M6281 Muscle weakness (generalized): Secondary | ICD-10-CM | POA: Diagnosis not present

## 2016-11-28 DIAGNOSIS — R279 Unspecified lack of coordination: Secondary | ICD-10-CM | POA: Diagnosis not present

## 2016-11-28 DIAGNOSIS — R1312 Dysphagia, oropharyngeal phase: Secondary | ICD-10-CM | POA: Diagnosis not present

## 2016-11-28 DIAGNOSIS — R262 Difficulty in walking, not elsewhere classified: Secondary | ICD-10-CM | POA: Diagnosis not present

## 2016-11-29 DIAGNOSIS — R1312 Dysphagia, oropharyngeal phase: Secondary | ICD-10-CM | POA: Diagnosis not present

## 2016-11-29 DIAGNOSIS — R262 Difficulty in walking, not elsewhere classified: Secondary | ICD-10-CM | POA: Diagnosis not present

## 2016-11-29 DIAGNOSIS — M6281 Muscle weakness (generalized): Secondary | ICD-10-CM | POA: Diagnosis not present

## 2016-11-29 DIAGNOSIS — N390 Urinary tract infection, site not specified: Secondary | ICD-10-CM | POA: Diagnosis not present

## 2016-11-29 DIAGNOSIS — R279 Unspecified lack of coordination: Secondary | ICD-10-CM | POA: Diagnosis not present

## 2016-11-29 DIAGNOSIS — R278 Other lack of coordination: Secondary | ICD-10-CM | POA: Diagnosis not present

## 2016-11-30 DIAGNOSIS — R4189 Other symptoms and signs involving cognitive functions and awareness: Secondary | ICD-10-CM | POA: Diagnosis not present

## 2016-11-30 DIAGNOSIS — R262 Difficulty in walking, not elsewhere classified: Secondary | ICD-10-CM | POA: Diagnosis not present

## 2016-11-30 DIAGNOSIS — R278 Other lack of coordination: Secondary | ICD-10-CM | POA: Diagnosis not present

## 2016-11-30 DIAGNOSIS — R279 Unspecified lack of coordination: Secondary | ICD-10-CM | POA: Diagnosis not present

## 2016-11-30 DIAGNOSIS — M6281 Muscle weakness (generalized): Secondary | ICD-10-CM | POA: Diagnosis not present

## 2016-11-30 DIAGNOSIS — N390 Urinary tract infection, site not specified: Secondary | ICD-10-CM | POA: Diagnosis not present

## 2016-11-30 DIAGNOSIS — R1312 Dysphagia, oropharyngeal phase: Secondary | ICD-10-CM | POA: Diagnosis not present

## 2016-12-01 DIAGNOSIS — R262 Difficulty in walking, not elsewhere classified: Secondary | ICD-10-CM | POA: Diagnosis not present

## 2016-12-01 DIAGNOSIS — R278 Other lack of coordination: Secondary | ICD-10-CM | POA: Diagnosis not present

## 2016-12-01 DIAGNOSIS — N390 Urinary tract infection, site not specified: Secondary | ICD-10-CM | POA: Diagnosis not present

## 2016-12-01 DIAGNOSIS — M6281 Muscle weakness (generalized): Secondary | ICD-10-CM | POA: Diagnosis not present

## 2016-12-01 DIAGNOSIS — R1312 Dysphagia, oropharyngeal phase: Secondary | ICD-10-CM | POA: Diagnosis not present

## 2016-12-01 DIAGNOSIS — R279 Unspecified lack of coordination: Secondary | ICD-10-CM | POA: Diagnosis not present

## 2016-12-03 DIAGNOSIS — R262 Difficulty in walking, not elsewhere classified: Secondary | ICD-10-CM | POA: Diagnosis not present

## 2016-12-03 DIAGNOSIS — R279 Unspecified lack of coordination: Secondary | ICD-10-CM | POA: Diagnosis not present

## 2016-12-03 DIAGNOSIS — N390 Urinary tract infection, site not specified: Secondary | ICD-10-CM | POA: Diagnosis not present

## 2016-12-03 DIAGNOSIS — M6281 Muscle weakness (generalized): Secondary | ICD-10-CM | POA: Diagnosis not present

## 2016-12-03 DIAGNOSIS — R278 Other lack of coordination: Secondary | ICD-10-CM | POA: Diagnosis not present

## 2016-12-03 DIAGNOSIS — R1312 Dysphagia, oropharyngeal phase: Secondary | ICD-10-CM | POA: Diagnosis not present

## 2016-12-04 DIAGNOSIS — N390 Urinary tract infection, site not specified: Secondary | ICD-10-CM | POA: Diagnosis not present

## 2016-12-04 DIAGNOSIS — R1312 Dysphagia, oropharyngeal phase: Secondary | ICD-10-CM | POA: Diagnosis not present

## 2016-12-04 DIAGNOSIS — R278 Other lack of coordination: Secondary | ICD-10-CM | POA: Diagnosis not present

## 2016-12-04 DIAGNOSIS — M6281 Muscle weakness (generalized): Secondary | ICD-10-CM | POA: Diagnosis not present

## 2016-12-04 DIAGNOSIS — R279 Unspecified lack of coordination: Secondary | ICD-10-CM | POA: Diagnosis not present

## 2016-12-04 DIAGNOSIS — R262 Difficulty in walking, not elsewhere classified: Secondary | ICD-10-CM | POA: Diagnosis not present

## 2016-12-05 DIAGNOSIS — M6281 Muscle weakness (generalized): Secondary | ICD-10-CM | POA: Diagnosis not present

## 2016-12-05 DIAGNOSIS — N390 Urinary tract infection, site not specified: Secondary | ICD-10-CM | POA: Diagnosis not present

## 2016-12-05 DIAGNOSIS — R1312 Dysphagia, oropharyngeal phase: Secondary | ICD-10-CM | POA: Diagnosis not present

## 2016-12-05 DIAGNOSIS — R279 Unspecified lack of coordination: Secondary | ICD-10-CM | POA: Diagnosis not present

## 2016-12-05 DIAGNOSIS — R262 Difficulty in walking, not elsewhere classified: Secondary | ICD-10-CM | POA: Diagnosis not present

## 2016-12-05 DIAGNOSIS — R278 Other lack of coordination: Secondary | ICD-10-CM | POA: Diagnosis not present

## 2016-12-06 DIAGNOSIS — M6281 Muscle weakness (generalized): Secondary | ICD-10-CM | POA: Diagnosis not present

## 2016-12-06 DIAGNOSIS — R279 Unspecified lack of coordination: Secondary | ICD-10-CM | POA: Diagnosis not present

## 2016-12-06 DIAGNOSIS — R262 Difficulty in walking, not elsewhere classified: Secondary | ICD-10-CM | POA: Diagnosis not present

## 2016-12-06 DIAGNOSIS — R1312 Dysphagia, oropharyngeal phase: Secondary | ICD-10-CM | POA: Diagnosis not present

## 2016-12-06 DIAGNOSIS — N390 Urinary tract infection, site not specified: Secondary | ICD-10-CM | POA: Diagnosis not present

## 2016-12-06 DIAGNOSIS — R278 Other lack of coordination: Secondary | ICD-10-CM | POA: Diagnosis not present

## 2016-12-07 DIAGNOSIS — M6281 Muscle weakness (generalized): Secondary | ICD-10-CM | POA: Diagnosis not present

## 2016-12-07 DIAGNOSIS — N390 Urinary tract infection, site not specified: Secondary | ICD-10-CM | POA: Diagnosis not present

## 2016-12-07 DIAGNOSIS — R279 Unspecified lack of coordination: Secondary | ICD-10-CM | POA: Diagnosis not present

## 2016-12-07 DIAGNOSIS — R262 Difficulty in walking, not elsewhere classified: Secondary | ICD-10-CM | POA: Diagnosis not present

## 2016-12-07 DIAGNOSIS — R1312 Dysphagia, oropharyngeal phase: Secondary | ICD-10-CM | POA: Diagnosis not present

## 2016-12-07 DIAGNOSIS — R278 Other lack of coordination: Secondary | ICD-10-CM | POA: Diagnosis not present

## 2016-12-08 DIAGNOSIS — R1312 Dysphagia, oropharyngeal phase: Secondary | ICD-10-CM | POA: Diagnosis not present

## 2016-12-08 DIAGNOSIS — N390 Urinary tract infection, site not specified: Secondary | ICD-10-CM | POA: Diagnosis not present

## 2016-12-08 DIAGNOSIS — R278 Other lack of coordination: Secondary | ICD-10-CM | POA: Diagnosis not present

## 2016-12-08 DIAGNOSIS — M6281 Muscle weakness (generalized): Secondary | ICD-10-CM | POA: Diagnosis not present

## 2016-12-08 DIAGNOSIS — R262 Difficulty in walking, not elsewhere classified: Secondary | ICD-10-CM | POA: Diagnosis not present

## 2016-12-08 DIAGNOSIS — R279 Unspecified lack of coordination: Secondary | ICD-10-CM | POA: Diagnosis not present

## 2016-12-11 DIAGNOSIS — M6281 Muscle weakness (generalized): Secondary | ICD-10-CM | POA: Diagnosis not present

## 2016-12-11 DIAGNOSIS — R1312 Dysphagia, oropharyngeal phase: Secondary | ICD-10-CM | POA: Diagnosis not present

## 2016-12-11 DIAGNOSIS — R279 Unspecified lack of coordination: Secondary | ICD-10-CM | POA: Diagnosis not present

## 2016-12-11 DIAGNOSIS — N390 Urinary tract infection, site not specified: Secondary | ICD-10-CM | POA: Diagnosis not present

## 2016-12-11 DIAGNOSIS — R262 Difficulty in walking, not elsewhere classified: Secondary | ICD-10-CM | POA: Diagnosis not present

## 2016-12-11 DIAGNOSIS — R278 Other lack of coordination: Secondary | ICD-10-CM | POA: Diagnosis not present

## 2016-12-12 DIAGNOSIS — R262 Difficulty in walking, not elsewhere classified: Secondary | ICD-10-CM | POA: Diagnosis not present

## 2016-12-12 DIAGNOSIS — R278 Other lack of coordination: Secondary | ICD-10-CM | POA: Diagnosis not present

## 2016-12-12 DIAGNOSIS — R279 Unspecified lack of coordination: Secondary | ICD-10-CM | POA: Diagnosis not present

## 2016-12-12 DIAGNOSIS — R1312 Dysphagia, oropharyngeal phase: Secondary | ICD-10-CM | POA: Diagnosis not present

## 2016-12-12 DIAGNOSIS — M6281 Muscle weakness (generalized): Secondary | ICD-10-CM | POA: Diagnosis not present

## 2016-12-12 DIAGNOSIS — N390 Urinary tract infection, site not specified: Secondary | ICD-10-CM | POA: Diagnosis not present

## 2016-12-13 DIAGNOSIS — N390 Urinary tract infection, site not specified: Secondary | ICD-10-CM | POA: Diagnosis not present

## 2016-12-13 DIAGNOSIS — R279 Unspecified lack of coordination: Secondary | ICD-10-CM | POA: Diagnosis not present

## 2016-12-13 DIAGNOSIS — R1312 Dysphagia, oropharyngeal phase: Secondary | ICD-10-CM | POA: Diagnosis not present

## 2016-12-13 DIAGNOSIS — R262 Difficulty in walking, not elsewhere classified: Secondary | ICD-10-CM | POA: Diagnosis not present

## 2016-12-13 DIAGNOSIS — R278 Other lack of coordination: Secondary | ICD-10-CM | POA: Diagnosis not present

## 2016-12-13 DIAGNOSIS — M6281 Muscle weakness (generalized): Secondary | ICD-10-CM | POA: Diagnosis not present

## 2016-12-14 ENCOUNTER — Encounter: Payer: Self-pay | Admitting: Family Medicine

## 2016-12-14 DIAGNOSIS — R262 Difficulty in walking, not elsewhere classified: Secondary | ICD-10-CM | POA: Diagnosis not present

## 2016-12-14 DIAGNOSIS — R279 Unspecified lack of coordination: Secondary | ICD-10-CM | POA: Diagnosis not present

## 2016-12-14 DIAGNOSIS — R278 Other lack of coordination: Secondary | ICD-10-CM | POA: Diagnosis not present

## 2016-12-14 DIAGNOSIS — M6281 Muscle weakness (generalized): Secondary | ICD-10-CM | POA: Diagnosis not present

## 2016-12-14 DIAGNOSIS — R1312 Dysphagia, oropharyngeal phase: Secondary | ICD-10-CM | POA: Diagnosis not present

## 2016-12-14 DIAGNOSIS — N390 Urinary tract infection, site not specified: Secondary | ICD-10-CM | POA: Diagnosis not present

## 2016-12-15 DIAGNOSIS — M6281 Muscle weakness (generalized): Secondary | ICD-10-CM | POA: Diagnosis not present

## 2016-12-15 DIAGNOSIS — R1312 Dysphagia, oropharyngeal phase: Secondary | ICD-10-CM | POA: Diagnosis not present

## 2016-12-15 DIAGNOSIS — N390 Urinary tract infection, site not specified: Secondary | ICD-10-CM | POA: Diagnosis not present

## 2016-12-15 DIAGNOSIS — R262 Difficulty in walking, not elsewhere classified: Secondary | ICD-10-CM | POA: Diagnosis not present

## 2016-12-15 DIAGNOSIS — R278 Other lack of coordination: Secondary | ICD-10-CM | POA: Diagnosis not present

## 2016-12-15 DIAGNOSIS — R279 Unspecified lack of coordination: Secondary | ICD-10-CM | POA: Diagnosis not present

## 2016-12-18 DIAGNOSIS — R262 Difficulty in walking, not elsewhere classified: Secondary | ICD-10-CM | POA: Diagnosis not present

## 2016-12-18 DIAGNOSIS — R279 Unspecified lack of coordination: Secondary | ICD-10-CM | POA: Diagnosis not present

## 2016-12-18 DIAGNOSIS — M6281 Muscle weakness (generalized): Secondary | ICD-10-CM | POA: Diagnosis not present

## 2016-12-18 DIAGNOSIS — R1312 Dysphagia, oropharyngeal phase: Secondary | ICD-10-CM | POA: Diagnosis not present

## 2016-12-18 DIAGNOSIS — R278 Other lack of coordination: Secondary | ICD-10-CM | POA: Diagnosis not present

## 2016-12-18 DIAGNOSIS — N390 Urinary tract infection, site not specified: Secondary | ICD-10-CM | POA: Diagnosis not present

## 2016-12-19 DIAGNOSIS — R1312 Dysphagia, oropharyngeal phase: Secondary | ICD-10-CM | POA: Diagnosis not present

## 2016-12-19 DIAGNOSIS — M6281 Muscle weakness (generalized): Secondary | ICD-10-CM | POA: Diagnosis not present

## 2016-12-19 DIAGNOSIS — R262 Difficulty in walking, not elsewhere classified: Secondary | ICD-10-CM | POA: Diagnosis not present

## 2016-12-19 DIAGNOSIS — R279 Unspecified lack of coordination: Secondary | ICD-10-CM | POA: Diagnosis not present

## 2016-12-19 DIAGNOSIS — N390 Urinary tract infection, site not specified: Secondary | ICD-10-CM | POA: Diagnosis not present

## 2016-12-19 DIAGNOSIS — R278 Other lack of coordination: Secondary | ICD-10-CM | POA: Diagnosis not present

## 2016-12-20 DIAGNOSIS — N390 Urinary tract infection, site not specified: Secondary | ICD-10-CM | POA: Diagnosis not present

## 2016-12-20 DIAGNOSIS — R278 Other lack of coordination: Secondary | ICD-10-CM | POA: Diagnosis not present

## 2016-12-20 DIAGNOSIS — R279 Unspecified lack of coordination: Secondary | ICD-10-CM | POA: Diagnosis not present

## 2016-12-20 DIAGNOSIS — R262 Difficulty in walking, not elsewhere classified: Secondary | ICD-10-CM | POA: Diagnosis not present

## 2016-12-20 DIAGNOSIS — M6281 Muscle weakness (generalized): Secondary | ICD-10-CM | POA: Diagnosis not present

## 2016-12-20 DIAGNOSIS — R1312 Dysphagia, oropharyngeal phase: Secondary | ICD-10-CM | POA: Diagnosis not present

## 2016-12-21 DIAGNOSIS — R278 Other lack of coordination: Secondary | ICD-10-CM | POA: Diagnosis not present

## 2016-12-21 DIAGNOSIS — R262 Difficulty in walking, not elsewhere classified: Secondary | ICD-10-CM | POA: Diagnosis not present

## 2016-12-21 DIAGNOSIS — R279 Unspecified lack of coordination: Secondary | ICD-10-CM | POA: Diagnosis not present

## 2016-12-21 DIAGNOSIS — R1312 Dysphagia, oropharyngeal phase: Secondary | ICD-10-CM | POA: Diagnosis not present

## 2016-12-21 DIAGNOSIS — N390 Urinary tract infection, site not specified: Secondary | ICD-10-CM | POA: Diagnosis not present

## 2016-12-21 DIAGNOSIS — M6281 Muscle weakness (generalized): Secondary | ICD-10-CM | POA: Diagnosis not present

## 2016-12-22 DIAGNOSIS — R279 Unspecified lack of coordination: Secondary | ICD-10-CM | POA: Diagnosis not present

## 2016-12-22 DIAGNOSIS — R262 Difficulty in walking, not elsewhere classified: Secondary | ICD-10-CM | POA: Diagnosis not present

## 2016-12-22 DIAGNOSIS — M6281 Muscle weakness (generalized): Secondary | ICD-10-CM | POA: Diagnosis not present

## 2016-12-22 DIAGNOSIS — R1312 Dysphagia, oropharyngeal phase: Secondary | ICD-10-CM | POA: Diagnosis not present

## 2016-12-22 DIAGNOSIS — N390 Urinary tract infection, site not specified: Secondary | ICD-10-CM | POA: Diagnosis not present

## 2016-12-22 DIAGNOSIS — R278 Other lack of coordination: Secondary | ICD-10-CM | POA: Diagnosis not present

## 2016-12-25 DIAGNOSIS — E1122 Type 2 diabetes mellitus with diabetic chronic kidney disease: Secondary | ICD-10-CM | POA: Diagnosis not present

## 2016-12-25 DIAGNOSIS — E11319 Type 2 diabetes mellitus with unspecified diabetic retinopathy without macular edema: Secondary | ICD-10-CM | POA: Diagnosis not present

## 2016-12-25 DIAGNOSIS — R262 Difficulty in walking, not elsewhere classified: Secondary | ICD-10-CM | POA: Diagnosis not present

## 2016-12-25 DIAGNOSIS — R279 Unspecified lack of coordination: Secondary | ICD-10-CM | POA: Diagnosis not present

## 2016-12-25 DIAGNOSIS — N183 Chronic kidney disease, stage 3 (moderate): Secondary | ICD-10-CM | POA: Diagnosis not present

## 2016-12-25 DIAGNOSIS — N390 Urinary tract infection, site not specified: Secondary | ICD-10-CM | POA: Diagnosis not present

## 2016-12-25 DIAGNOSIS — R278 Other lack of coordination: Secondary | ICD-10-CM | POA: Diagnosis not present

## 2016-12-25 DIAGNOSIS — F331 Major depressive disorder, recurrent, moderate: Secondary | ICD-10-CM | POA: Diagnosis not present

## 2016-12-25 DIAGNOSIS — M6281 Muscle weakness (generalized): Secondary | ICD-10-CM | POA: Diagnosis not present

## 2016-12-25 DIAGNOSIS — R1312 Dysphagia, oropharyngeal phase: Secondary | ICD-10-CM | POA: Diagnosis not present

## 2016-12-26 DIAGNOSIS — M6281 Muscle weakness (generalized): Secondary | ICD-10-CM | POA: Diagnosis not present

## 2016-12-26 DIAGNOSIS — N390 Urinary tract infection, site not specified: Secondary | ICD-10-CM | POA: Diagnosis not present

## 2016-12-26 DIAGNOSIS — R279 Unspecified lack of coordination: Secondary | ICD-10-CM | POA: Diagnosis not present

## 2016-12-26 DIAGNOSIS — R278 Other lack of coordination: Secondary | ICD-10-CM | POA: Diagnosis not present

## 2016-12-26 DIAGNOSIS — R1312 Dysphagia, oropharyngeal phase: Secondary | ICD-10-CM | POA: Diagnosis not present

## 2016-12-26 DIAGNOSIS — R262 Difficulty in walking, not elsewhere classified: Secondary | ICD-10-CM | POA: Diagnosis not present

## 2016-12-27 DIAGNOSIS — N390 Urinary tract infection, site not specified: Secondary | ICD-10-CM | POA: Diagnosis not present

## 2016-12-27 DIAGNOSIS — R279 Unspecified lack of coordination: Secondary | ICD-10-CM | POA: Diagnosis not present

## 2016-12-27 DIAGNOSIS — R278 Other lack of coordination: Secondary | ICD-10-CM | POA: Diagnosis not present

## 2016-12-27 DIAGNOSIS — R1312 Dysphagia, oropharyngeal phase: Secondary | ICD-10-CM | POA: Diagnosis not present

## 2016-12-27 DIAGNOSIS — M6281 Muscle weakness (generalized): Secondary | ICD-10-CM | POA: Diagnosis not present

## 2016-12-27 DIAGNOSIS — E039 Hypothyroidism, unspecified: Secondary | ICD-10-CM | POA: Diagnosis not present

## 2016-12-27 DIAGNOSIS — R262 Difficulty in walking, not elsewhere classified: Secondary | ICD-10-CM | POA: Diagnosis not present

## 2017-01-12 DIAGNOSIS — E785 Hyperlipidemia, unspecified: Secondary | ICD-10-CM | POA: Diagnosis not present

## 2017-01-12 DIAGNOSIS — I10 Essential (primary) hypertension: Secondary | ICD-10-CM | POA: Diagnosis not present

## 2017-01-22 DIAGNOSIS — F339 Major depressive disorder, recurrent, unspecified: Secondary | ICD-10-CM | POA: Diagnosis not present

## 2017-01-22 DIAGNOSIS — M6282 Rhabdomyolysis: Secondary | ICD-10-CM | POA: Diagnosis not present

## 2017-01-22 DIAGNOSIS — N289 Disorder of kidney and ureter, unspecified: Secondary | ICD-10-CM | POA: Diagnosis not present

## 2017-01-22 DIAGNOSIS — N183 Chronic kidney disease, stage 3 (moderate): Secondary | ICD-10-CM | POA: Diagnosis not present

## 2017-01-26 DIAGNOSIS — M2042 Other hammer toe(s) (acquired), left foot: Secondary | ICD-10-CM | POA: Diagnosis not present

## 2017-01-26 DIAGNOSIS — B351 Tinea unguium: Secondary | ICD-10-CM | POA: Diagnosis not present

## 2017-01-26 DIAGNOSIS — E1329 Other specified diabetes mellitus with other diabetic kidney complication: Secondary | ICD-10-CM | POA: Diagnosis not present

## 2017-01-26 DIAGNOSIS — L84 Corns and callosities: Secondary | ICD-10-CM | POA: Diagnosis not present

## 2017-01-29 ENCOUNTER — Encounter: Payer: Self-pay | Admitting: Family Medicine

## 2017-02-03 DIAGNOSIS — I517 Cardiomegaly: Secondary | ICD-10-CM | POA: Diagnosis not present

## 2017-02-05 DIAGNOSIS — I10 Essential (primary) hypertension: Secondary | ICD-10-CM | POA: Diagnosis not present

## 2017-02-05 DIAGNOSIS — R609 Edema, unspecified: Secondary | ICD-10-CM | POA: Diagnosis not present

## 2017-02-05 DIAGNOSIS — E1329 Other specified diabetes mellitus with other diabetic kidney complication: Secondary | ICD-10-CM | POA: Diagnosis not present

## 2017-02-05 DIAGNOSIS — N183 Chronic kidney disease, stage 3 (moderate): Secondary | ICD-10-CM | POA: Diagnosis not present

## 2017-02-08 DIAGNOSIS — Z79899 Other long term (current) drug therapy: Secondary | ICD-10-CM | POA: Diagnosis not present

## 2017-02-08 DIAGNOSIS — E119 Type 2 diabetes mellitus without complications: Secondary | ICD-10-CM | POA: Diagnosis not present

## 2017-02-08 DIAGNOSIS — E782 Mixed hyperlipidemia: Secondary | ICD-10-CM | POA: Diagnosis not present

## 2017-02-08 DIAGNOSIS — D518 Other vitamin B12 deficiency anemias: Secondary | ICD-10-CM | POA: Diagnosis not present

## 2017-02-19 DIAGNOSIS — N183 Chronic kidney disease, stage 3 (moderate): Secondary | ICD-10-CM | POA: Diagnosis not present

## 2017-02-19 DIAGNOSIS — F339 Major depressive disorder, recurrent, unspecified: Secondary | ICD-10-CM | POA: Diagnosis not present

## 2017-02-19 DIAGNOSIS — F39 Unspecified mood [affective] disorder: Secondary | ICD-10-CM | POA: Diagnosis not present

## 2017-02-19 DIAGNOSIS — E1365 Other specified diabetes mellitus with hyperglycemia: Secondary | ICD-10-CM | POA: Diagnosis not present

## 2017-02-19 DIAGNOSIS — F312 Bipolar disorder, current episode manic severe with psychotic features: Secondary | ICD-10-CM | POA: Diagnosis not present

## 2017-02-19 DIAGNOSIS — M6282 Rhabdomyolysis: Secondary | ICD-10-CM | POA: Diagnosis not present

## 2017-03-06 DIAGNOSIS — F319 Bipolar disorder, unspecified: Secondary | ICD-10-CM | POA: Diagnosis not present

## 2017-03-06 DIAGNOSIS — M25562 Pain in left knee: Secondary | ICD-10-CM | POA: Diagnosis not present

## 2017-03-06 DIAGNOSIS — S098XXA Other specified injuries of head, initial encounter: Secondary | ICD-10-CM | POA: Diagnosis not present

## 2017-03-06 DIAGNOSIS — Z23 Encounter for immunization: Secondary | ICD-10-CM | POA: Diagnosis not present

## 2017-03-06 DIAGNOSIS — S0181XA Laceration without foreign body of other part of head, initial encounter: Secondary | ICD-10-CM | POA: Diagnosis not present

## 2017-03-06 DIAGNOSIS — S01511A Laceration without foreign body of lip, initial encounter: Secondary | ICD-10-CM | POA: Diagnosis not present

## 2017-03-06 DIAGNOSIS — Z79899 Other long term (current) drug therapy: Secondary | ICD-10-CM | POA: Diagnosis not present

## 2017-03-06 DIAGNOSIS — I10 Essential (primary) hypertension: Secondary | ICD-10-CM | POA: Diagnosis not present

## 2017-03-06 DIAGNOSIS — Z8673 Personal history of transient ischemic attack (TIA), and cerebral infarction without residual deficits: Secondary | ICD-10-CM | POA: Diagnosis not present

## 2017-03-06 DIAGNOSIS — Z794 Long term (current) use of insulin: Secondary | ICD-10-CM | POA: Diagnosis not present

## 2017-03-06 DIAGNOSIS — J45909 Unspecified asthma, uncomplicated: Secondary | ICD-10-CM | POA: Diagnosis not present

## 2017-03-07 DIAGNOSIS — R262 Difficulty in walking, not elsewhere classified: Secondary | ICD-10-CM | POA: Diagnosis not present

## 2017-03-07 DIAGNOSIS — R279 Unspecified lack of coordination: Secondary | ICD-10-CM | POA: Diagnosis not present

## 2017-03-07 DIAGNOSIS — R4189 Other symptoms and signs involving cognitive functions and awareness: Secondary | ICD-10-CM | POA: Diagnosis not present

## 2017-03-07 DIAGNOSIS — N390 Urinary tract infection, site not specified: Secondary | ICD-10-CM | POA: Diagnosis not present

## 2017-03-07 DIAGNOSIS — R278 Other lack of coordination: Secondary | ICD-10-CM | POA: Diagnosis not present

## 2017-03-07 DIAGNOSIS — R1312 Dysphagia, oropharyngeal phase: Secondary | ICD-10-CM | POA: Diagnosis not present

## 2017-03-07 DIAGNOSIS — M6281 Muscle weakness (generalized): Secondary | ICD-10-CM | POA: Diagnosis not present

## 2017-03-08 DIAGNOSIS — M6281 Muscle weakness (generalized): Secondary | ICD-10-CM | POA: Diagnosis not present

## 2017-03-08 DIAGNOSIS — R279 Unspecified lack of coordination: Secondary | ICD-10-CM | POA: Diagnosis not present

## 2017-03-08 DIAGNOSIS — N390 Urinary tract infection, site not specified: Secondary | ICD-10-CM | POA: Diagnosis not present

## 2017-03-08 DIAGNOSIS — R262 Difficulty in walking, not elsewhere classified: Secondary | ICD-10-CM | POA: Diagnosis not present

## 2017-03-08 DIAGNOSIS — R1312 Dysphagia, oropharyngeal phase: Secondary | ICD-10-CM | POA: Diagnosis not present

## 2017-03-08 DIAGNOSIS — R278 Other lack of coordination: Secondary | ICD-10-CM | POA: Diagnosis not present

## 2017-03-09 DIAGNOSIS — N390 Urinary tract infection, site not specified: Secondary | ICD-10-CM | POA: Diagnosis not present

## 2017-03-09 DIAGNOSIS — R278 Other lack of coordination: Secondary | ICD-10-CM | POA: Diagnosis not present

## 2017-03-09 DIAGNOSIS — R262 Difficulty in walking, not elsewhere classified: Secondary | ICD-10-CM | POA: Diagnosis not present

## 2017-03-09 DIAGNOSIS — M6281 Muscle weakness (generalized): Secondary | ICD-10-CM | POA: Diagnosis not present

## 2017-03-09 DIAGNOSIS — R279 Unspecified lack of coordination: Secondary | ICD-10-CM | POA: Diagnosis not present

## 2017-03-09 DIAGNOSIS — R1312 Dysphagia, oropharyngeal phase: Secondary | ICD-10-CM | POA: Diagnosis not present

## 2017-03-12 DIAGNOSIS — R278 Other lack of coordination: Secondary | ICD-10-CM | POA: Diagnosis not present

## 2017-03-12 DIAGNOSIS — N390 Urinary tract infection, site not specified: Secondary | ICD-10-CM | POA: Diagnosis not present

## 2017-03-12 DIAGNOSIS — R1312 Dysphagia, oropharyngeal phase: Secondary | ICD-10-CM | POA: Diagnosis not present

## 2017-03-12 DIAGNOSIS — R279 Unspecified lack of coordination: Secondary | ICD-10-CM | POA: Diagnosis not present

## 2017-03-12 DIAGNOSIS — R262 Difficulty in walking, not elsewhere classified: Secondary | ICD-10-CM | POA: Diagnosis not present

## 2017-03-12 DIAGNOSIS — M6281 Muscle weakness (generalized): Secondary | ICD-10-CM | POA: Diagnosis not present

## 2017-03-13 DIAGNOSIS — R262 Difficulty in walking, not elsewhere classified: Secondary | ICD-10-CM | POA: Diagnosis not present

## 2017-03-13 DIAGNOSIS — R278 Other lack of coordination: Secondary | ICD-10-CM | POA: Diagnosis not present

## 2017-03-13 DIAGNOSIS — R279 Unspecified lack of coordination: Secondary | ICD-10-CM | POA: Diagnosis not present

## 2017-03-13 DIAGNOSIS — M6281 Muscle weakness (generalized): Secondary | ICD-10-CM | POA: Diagnosis not present

## 2017-03-13 DIAGNOSIS — R1312 Dysphagia, oropharyngeal phase: Secondary | ICD-10-CM | POA: Diagnosis not present

## 2017-03-13 DIAGNOSIS — N390 Urinary tract infection, site not specified: Secondary | ICD-10-CM | POA: Diagnosis not present

## 2017-03-14 DIAGNOSIS — R278 Other lack of coordination: Secondary | ICD-10-CM | POA: Diagnosis not present

## 2017-03-14 DIAGNOSIS — R1312 Dysphagia, oropharyngeal phase: Secondary | ICD-10-CM | POA: Diagnosis not present

## 2017-03-14 DIAGNOSIS — R279 Unspecified lack of coordination: Secondary | ICD-10-CM | POA: Diagnosis not present

## 2017-03-14 DIAGNOSIS — N390 Urinary tract infection, site not specified: Secondary | ICD-10-CM | POA: Diagnosis not present

## 2017-03-14 DIAGNOSIS — M6281 Muscle weakness (generalized): Secondary | ICD-10-CM | POA: Diagnosis not present

## 2017-03-14 DIAGNOSIS — R262 Difficulty in walking, not elsewhere classified: Secondary | ICD-10-CM | POA: Diagnosis not present

## 2017-03-15 DIAGNOSIS — R278 Other lack of coordination: Secondary | ICD-10-CM | POA: Diagnosis not present

## 2017-03-15 DIAGNOSIS — R262 Difficulty in walking, not elsewhere classified: Secondary | ICD-10-CM | POA: Diagnosis not present

## 2017-03-15 DIAGNOSIS — M6281 Muscle weakness (generalized): Secondary | ICD-10-CM | POA: Diagnosis not present

## 2017-03-15 DIAGNOSIS — N390 Urinary tract infection, site not specified: Secondary | ICD-10-CM | POA: Diagnosis not present

## 2017-03-15 DIAGNOSIS — R1312 Dysphagia, oropharyngeal phase: Secondary | ICD-10-CM | POA: Diagnosis not present

## 2017-03-15 DIAGNOSIS — R279 Unspecified lack of coordination: Secondary | ICD-10-CM | POA: Diagnosis not present

## 2017-03-16 DIAGNOSIS — R1312 Dysphagia, oropharyngeal phase: Secondary | ICD-10-CM | POA: Diagnosis not present

## 2017-03-16 DIAGNOSIS — R262 Difficulty in walking, not elsewhere classified: Secondary | ICD-10-CM | POA: Diagnosis not present

## 2017-03-16 DIAGNOSIS — R279 Unspecified lack of coordination: Secondary | ICD-10-CM | POA: Diagnosis not present

## 2017-03-16 DIAGNOSIS — R278 Other lack of coordination: Secondary | ICD-10-CM | POA: Diagnosis not present

## 2017-03-16 DIAGNOSIS — N390 Urinary tract infection, site not specified: Secondary | ICD-10-CM | POA: Diagnosis not present

## 2017-03-16 DIAGNOSIS — M6281 Muscle weakness (generalized): Secondary | ICD-10-CM | POA: Diagnosis not present

## 2017-03-19 DIAGNOSIS — R1312 Dysphagia, oropharyngeal phase: Secondary | ICD-10-CM | POA: Diagnosis not present

## 2017-03-19 DIAGNOSIS — M6282 Rhabdomyolysis: Secondary | ICD-10-CM | POA: Diagnosis not present

## 2017-03-19 DIAGNOSIS — N183 Chronic kidney disease, stage 3 (moderate): Secondary | ICD-10-CM | POA: Diagnosis not present

## 2017-03-19 DIAGNOSIS — R279 Unspecified lack of coordination: Secondary | ICD-10-CM | POA: Diagnosis not present

## 2017-03-19 DIAGNOSIS — N390 Urinary tract infection, site not specified: Secondary | ICD-10-CM | POA: Diagnosis not present

## 2017-03-19 DIAGNOSIS — R278 Other lack of coordination: Secondary | ICD-10-CM | POA: Diagnosis not present

## 2017-03-19 DIAGNOSIS — F339 Major depressive disorder, recurrent, unspecified: Secondary | ICD-10-CM | POA: Diagnosis not present

## 2017-03-19 DIAGNOSIS — M6281 Muscle weakness (generalized): Secondary | ICD-10-CM | POA: Diagnosis not present

## 2017-03-19 DIAGNOSIS — E1165 Type 2 diabetes mellitus with hyperglycemia: Secondary | ICD-10-CM | POA: Diagnosis not present

## 2017-03-19 DIAGNOSIS — R262 Difficulty in walking, not elsewhere classified: Secondary | ICD-10-CM | POA: Diagnosis not present

## 2017-03-20 DIAGNOSIS — R1312 Dysphagia, oropharyngeal phase: Secondary | ICD-10-CM | POA: Diagnosis not present

## 2017-03-20 DIAGNOSIS — N390 Urinary tract infection, site not specified: Secondary | ICD-10-CM | POA: Diagnosis not present

## 2017-03-20 DIAGNOSIS — M6281 Muscle weakness (generalized): Secondary | ICD-10-CM | POA: Diagnosis not present

## 2017-03-20 DIAGNOSIS — R279 Unspecified lack of coordination: Secondary | ICD-10-CM | POA: Diagnosis not present

## 2017-03-20 DIAGNOSIS — R262 Difficulty in walking, not elsewhere classified: Secondary | ICD-10-CM | POA: Diagnosis not present

## 2017-03-20 DIAGNOSIS — R278 Other lack of coordination: Secondary | ICD-10-CM | POA: Diagnosis not present

## 2017-03-21 DIAGNOSIS — N390 Urinary tract infection, site not specified: Secondary | ICD-10-CM | POA: Diagnosis not present

## 2017-03-21 DIAGNOSIS — R278 Other lack of coordination: Secondary | ICD-10-CM | POA: Diagnosis not present

## 2017-03-21 DIAGNOSIS — R279 Unspecified lack of coordination: Secondary | ICD-10-CM | POA: Diagnosis not present

## 2017-03-21 DIAGNOSIS — R1312 Dysphagia, oropharyngeal phase: Secondary | ICD-10-CM | POA: Diagnosis not present

## 2017-03-21 DIAGNOSIS — R262 Difficulty in walking, not elsewhere classified: Secondary | ICD-10-CM | POA: Diagnosis not present

## 2017-03-21 DIAGNOSIS — M6281 Muscle weakness (generalized): Secondary | ICD-10-CM | POA: Diagnosis not present

## 2017-03-22 DIAGNOSIS — N390 Urinary tract infection, site not specified: Secondary | ICD-10-CM | POA: Diagnosis not present

## 2017-03-22 DIAGNOSIS — R262 Difficulty in walking, not elsewhere classified: Secondary | ICD-10-CM | POA: Diagnosis not present

## 2017-03-22 DIAGNOSIS — R279 Unspecified lack of coordination: Secondary | ICD-10-CM | POA: Diagnosis not present

## 2017-03-22 DIAGNOSIS — M6281 Muscle weakness (generalized): Secondary | ICD-10-CM | POA: Diagnosis not present

## 2017-03-22 DIAGNOSIS — R278 Other lack of coordination: Secondary | ICD-10-CM | POA: Diagnosis not present

## 2017-03-22 DIAGNOSIS — R1312 Dysphagia, oropharyngeal phase: Secondary | ICD-10-CM | POA: Diagnosis not present

## 2017-03-23 DIAGNOSIS — M6281 Muscle weakness (generalized): Secondary | ICD-10-CM | POA: Diagnosis not present

## 2017-03-23 DIAGNOSIS — R278 Other lack of coordination: Secondary | ICD-10-CM | POA: Diagnosis not present

## 2017-03-23 DIAGNOSIS — R262 Difficulty in walking, not elsewhere classified: Secondary | ICD-10-CM | POA: Diagnosis not present

## 2017-03-23 DIAGNOSIS — R279 Unspecified lack of coordination: Secondary | ICD-10-CM | POA: Diagnosis not present

## 2017-03-23 DIAGNOSIS — N390 Urinary tract infection, site not specified: Secondary | ICD-10-CM | POA: Diagnosis not present

## 2017-03-23 DIAGNOSIS — R1312 Dysphagia, oropharyngeal phase: Secondary | ICD-10-CM | POA: Diagnosis not present

## 2017-03-26 DIAGNOSIS — R278 Other lack of coordination: Secondary | ICD-10-CM | POA: Diagnosis not present

## 2017-03-26 DIAGNOSIS — R262 Difficulty in walking, not elsewhere classified: Secondary | ICD-10-CM | POA: Diagnosis not present

## 2017-03-26 DIAGNOSIS — R279 Unspecified lack of coordination: Secondary | ICD-10-CM | POA: Diagnosis not present

## 2017-03-26 DIAGNOSIS — F312 Bipolar disorder, current episode manic severe with psychotic features: Secondary | ICD-10-CM | POA: Diagnosis not present

## 2017-03-26 DIAGNOSIS — M6281 Muscle weakness (generalized): Secondary | ICD-10-CM | POA: Diagnosis not present

## 2017-03-26 DIAGNOSIS — R1312 Dysphagia, oropharyngeal phase: Secondary | ICD-10-CM | POA: Diagnosis not present

## 2017-03-26 DIAGNOSIS — F39 Unspecified mood [affective] disorder: Secondary | ICD-10-CM | POA: Diagnosis not present

## 2017-03-26 DIAGNOSIS — G4700 Insomnia, unspecified: Secondary | ICD-10-CM | POA: Diagnosis not present

## 2017-03-26 DIAGNOSIS — F419 Anxiety disorder, unspecified: Secondary | ICD-10-CM | POA: Diagnosis not present

## 2017-03-26 DIAGNOSIS — N390 Urinary tract infection, site not specified: Secondary | ICD-10-CM | POA: Diagnosis not present

## 2017-03-27 DIAGNOSIS — R278 Other lack of coordination: Secondary | ICD-10-CM | POA: Diagnosis not present

## 2017-03-27 DIAGNOSIS — M6281 Muscle weakness (generalized): Secondary | ICD-10-CM | POA: Diagnosis not present

## 2017-03-27 DIAGNOSIS — N390 Urinary tract infection, site not specified: Secondary | ICD-10-CM | POA: Diagnosis not present

## 2017-03-27 DIAGNOSIS — R262 Difficulty in walking, not elsewhere classified: Secondary | ICD-10-CM | POA: Diagnosis not present

## 2017-03-27 DIAGNOSIS — R279 Unspecified lack of coordination: Secondary | ICD-10-CM | POA: Diagnosis not present

## 2017-03-27 DIAGNOSIS — R1312 Dysphagia, oropharyngeal phase: Secondary | ICD-10-CM | POA: Diagnosis not present

## 2017-03-28 DIAGNOSIS — R278 Other lack of coordination: Secondary | ICD-10-CM | POA: Diagnosis not present

## 2017-03-28 DIAGNOSIS — R1312 Dysphagia, oropharyngeal phase: Secondary | ICD-10-CM | POA: Diagnosis not present

## 2017-03-28 DIAGNOSIS — R279 Unspecified lack of coordination: Secondary | ICD-10-CM | POA: Diagnosis not present

## 2017-03-28 DIAGNOSIS — N390 Urinary tract infection, site not specified: Secondary | ICD-10-CM | POA: Diagnosis not present

## 2017-03-28 DIAGNOSIS — M6281 Muscle weakness (generalized): Secondary | ICD-10-CM | POA: Diagnosis not present

## 2017-03-28 DIAGNOSIS — R262 Difficulty in walking, not elsewhere classified: Secondary | ICD-10-CM | POA: Diagnosis not present

## 2017-03-29 DIAGNOSIS — R1312 Dysphagia, oropharyngeal phase: Secondary | ICD-10-CM | POA: Diagnosis not present

## 2017-03-29 DIAGNOSIS — R279 Unspecified lack of coordination: Secondary | ICD-10-CM | POA: Diagnosis not present

## 2017-03-29 DIAGNOSIS — M6281 Muscle weakness (generalized): Secondary | ICD-10-CM | POA: Diagnosis not present

## 2017-03-29 DIAGNOSIS — R262 Difficulty in walking, not elsewhere classified: Secondary | ICD-10-CM | POA: Diagnosis not present

## 2017-03-29 DIAGNOSIS — R278 Other lack of coordination: Secondary | ICD-10-CM | POA: Diagnosis not present

## 2017-03-29 DIAGNOSIS — N390 Urinary tract infection, site not specified: Secondary | ICD-10-CM | POA: Diagnosis not present

## 2017-03-30 DIAGNOSIS — M6281 Muscle weakness (generalized): Secondary | ICD-10-CM | POA: Diagnosis not present

## 2017-03-30 DIAGNOSIS — R262 Difficulty in walking, not elsewhere classified: Secondary | ICD-10-CM | POA: Diagnosis not present

## 2017-03-30 DIAGNOSIS — N390 Urinary tract infection, site not specified: Secondary | ICD-10-CM | POA: Diagnosis not present

## 2017-03-30 DIAGNOSIS — R1312 Dysphagia, oropharyngeal phase: Secondary | ICD-10-CM | POA: Diagnosis not present

## 2017-03-30 DIAGNOSIS — R279 Unspecified lack of coordination: Secondary | ICD-10-CM | POA: Diagnosis not present

## 2017-03-30 DIAGNOSIS — R278 Other lack of coordination: Secondary | ICD-10-CM | POA: Diagnosis not present

## 2017-04-02 DIAGNOSIS — M6281 Muscle weakness (generalized): Secondary | ICD-10-CM | POA: Diagnosis not present

## 2017-04-02 DIAGNOSIS — R296 Repeated falls: Secondary | ICD-10-CM | POA: Diagnosis not present

## 2017-04-02 DIAGNOSIS — N183 Chronic kidney disease, stage 3 (moderate): Secondary | ICD-10-CM | POA: Diagnosis not present

## 2017-04-02 DIAGNOSIS — F39 Unspecified mood [affective] disorder: Secondary | ICD-10-CM | POA: Diagnosis not present

## 2017-04-02 DIAGNOSIS — E1165 Type 2 diabetes mellitus with hyperglycemia: Secondary | ICD-10-CM | POA: Diagnosis not present

## 2017-04-03 DIAGNOSIS — Z79899 Other long term (current) drug therapy: Secondary | ICD-10-CM | POA: Diagnosis not present

## 2017-04-03 DIAGNOSIS — R296 Repeated falls: Secondary | ICD-10-CM | POA: Diagnosis not present

## 2017-04-03 DIAGNOSIS — N183 Chronic kidney disease, stage 3 (moderate): Secondary | ICD-10-CM | POA: Diagnosis not present

## 2017-04-03 DIAGNOSIS — M6281 Muscle weakness (generalized): Secondary | ICD-10-CM | POA: Diagnosis not present

## 2017-04-03 DIAGNOSIS — E1165 Type 2 diabetes mellitus with hyperglycemia: Secondary | ICD-10-CM | POA: Diagnosis not present

## 2017-04-03 DIAGNOSIS — D518 Other vitamin B12 deficiency anemias: Secondary | ICD-10-CM | POA: Diagnosis not present

## 2017-04-03 DIAGNOSIS — E119 Type 2 diabetes mellitus without complications: Secondary | ICD-10-CM | POA: Diagnosis not present

## 2017-04-03 DIAGNOSIS — E782 Mixed hyperlipidemia: Secondary | ICD-10-CM | POA: Diagnosis not present

## 2017-04-03 DIAGNOSIS — F39 Unspecified mood [affective] disorder: Secondary | ICD-10-CM | POA: Diagnosis not present

## 2017-04-04 DIAGNOSIS — R296 Repeated falls: Secondary | ICD-10-CM | POA: Diagnosis not present

## 2017-04-04 DIAGNOSIS — N183 Chronic kidney disease, stage 3 (moderate): Secondary | ICD-10-CM | POA: Diagnosis not present

## 2017-04-04 DIAGNOSIS — F39 Unspecified mood [affective] disorder: Secondary | ICD-10-CM | POA: Diagnosis not present

## 2017-04-04 DIAGNOSIS — E1165 Type 2 diabetes mellitus with hyperglycemia: Secondary | ICD-10-CM | POA: Diagnosis not present

## 2017-04-04 DIAGNOSIS — M6281 Muscle weakness (generalized): Secondary | ICD-10-CM | POA: Diagnosis not present

## 2017-04-05 DIAGNOSIS — E1165 Type 2 diabetes mellitus with hyperglycemia: Secondary | ICD-10-CM | POA: Diagnosis not present

## 2017-04-05 DIAGNOSIS — R296 Repeated falls: Secondary | ICD-10-CM | POA: Diagnosis not present

## 2017-04-05 DIAGNOSIS — F39 Unspecified mood [affective] disorder: Secondary | ICD-10-CM | POA: Diagnosis not present

## 2017-04-05 DIAGNOSIS — N183 Chronic kidney disease, stage 3 (moderate): Secondary | ICD-10-CM | POA: Diagnosis not present

## 2017-04-05 DIAGNOSIS — M6281 Muscle weakness (generalized): Secondary | ICD-10-CM | POA: Diagnosis not present

## 2017-04-06 DIAGNOSIS — E1165 Type 2 diabetes mellitus with hyperglycemia: Secondary | ICD-10-CM | POA: Diagnosis not present

## 2017-04-06 DIAGNOSIS — M6281 Muscle weakness (generalized): Secondary | ICD-10-CM | POA: Diagnosis not present

## 2017-04-06 DIAGNOSIS — R296 Repeated falls: Secondary | ICD-10-CM | POA: Diagnosis not present

## 2017-04-06 DIAGNOSIS — N183 Chronic kidney disease, stage 3 (moderate): Secondary | ICD-10-CM | POA: Diagnosis not present

## 2017-04-06 DIAGNOSIS — F39 Unspecified mood [affective] disorder: Secondary | ICD-10-CM | POA: Diagnosis not present

## 2017-04-16 DIAGNOSIS — E1365 Other specified diabetes mellitus with hyperglycemia: Secondary | ICD-10-CM | POA: Diagnosis not present

## 2017-04-16 DIAGNOSIS — N183 Chronic kidney disease, stage 3 (moderate): Secondary | ICD-10-CM | POA: Diagnosis not present

## 2017-04-16 DIAGNOSIS — E1329 Other specified diabetes mellitus with other diabetic kidney complication: Secondary | ICD-10-CM | POA: Diagnosis not present

## 2017-04-16 DIAGNOSIS — N289 Disorder of kidney and ureter, unspecified: Secondary | ICD-10-CM | POA: Diagnosis not present

## 2017-04-17 DIAGNOSIS — M79675 Pain in left toe(s): Secondary | ICD-10-CM | POA: Diagnosis not present

## 2017-04-17 DIAGNOSIS — L84 Corns and callosities: Secondary | ICD-10-CM | POA: Diagnosis not present

## 2017-04-17 DIAGNOSIS — E1165 Type 2 diabetes mellitus with hyperglycemia: Secondary | ICD-10-CM | POA: Diagnosis not present

## 2017-04-17 DIAGNOSIS — B351 Tinea unguium: Secondary | ICD-10-CM | POA: Diagnosis not present

## 2017-05-14 DIAGNOSIS — B379 Candidiasis, unspecified: Secondary | ICD-10-CM | POA: Diagnosis not present

## 2017-05-14 DIAGNOSIS — H26493 Other secondary cataract, bilateral: Secondary | ICD-10-CM | POA: Diagnosis not present

## 2017-05-14 DIAGNOSIS — F339 Major depressive disorder, recurrent, unspecified: Secondary | ICD-10-CM | POA: Diagnosis not present

## 2017-05-14 DIAGNOSIS — Z794 Long term (current) use of insulin: Secondary | ICD-10-CM | POA: Diagnosis not present

## 2017-05-14 DIAGNOSIS — N183 Chronic kidney disease, stage 3 (moderate): Secondary | ICD-10-CM | POA: Diagnosis not present

## 2017-05-14 DIAGNOSIS — Z961 Presence of intraocular lens: Secondary | ICD-10-CM | POA: Diagnosis not present

## 2017-05-14 DIAGNOSIS — M6282 Rhabdomyolysis: Secondary | ICD-10-CM | POA: Diagnosis not present

## 2017-05-14 DIAGNOSIS — E113293 Type 2 diabetes mellitus with mild nonproliferative diabetic retinopathy without macular edema, bilateral: Secondary | ICD-10-CM | POA: Diagnosis not present

## 2017-06-19 DIAGNOSIS — I10 Essential (primary) hypertension: Secondary | ICD-10-CM | POA: Diagnosis not present

## 2017-06-19 DIAGNOSIS — L309 Dermatitis, unspecified: Secondary | ICD-10-CM | POA: Diagnosis not present

## 2017-06-22 DIAGNOSIS — N183 Chronic kidney disease, stage 3 (moderate): Secondary | ICD-10-CM | POA: Diagnosis not present

## 2017-06-22 DIAGNOSIS — E1165 Type 2 diabetes mellitus with hyperglycemia: Secondary | ICD-10-CM | POA: Diagnosis not present

## 2017-06-22 DIAGNOSIS — I10 Essential (primary) hypertension: Secondary | ICD-10-CM | POA: Diagnosis not present

## 2017-06-22 DIAGNOSIS — L309 Dermatitis, unspecified: Secondary | ICD-10-CM | POA: Diagnosis not present

## 2017-06-27 DIAGNOSIS — D518 Other vitamin B12 deficiency anemias: Secondary | ICD-10-CM | POA: Diagnosis not present

## 2017-06-27 DIAGNOSIS — Z79899 Other long term (current) drug therapy: Secondary | ICD-10-CM | POA: Diagnosis not present

## 2017-06-27 DIAGNOSIS — E119 Type 2 diabetes mellitus without complications: Secondary | ICD-10-CM | POA: Diagnosis not present

## 2017-06-27 DIAGNOSIS — E782 Mixed hyperlipidemia: Secondary | ICD-10-CM | POA: Diagnosis not present

## 2017-07-10 DIAGNOSIS — Z23 Encounter for immunization: Secondary | ICD-10-CM | POA: Diagnosis not present

## 2017-07-19 DIAGNOSIS — I10 Essential (primary) hypertension: Secondary | ICD-10-CM | POA: Diagnosis not present

## 2017-07-19 DIAGNOSIS — E1122 Type 2 diabetes mellitus with diabetic chronic kidney disease: Secondary | ICD-10-CM | POA: Diagnosis not present

## 2017-07-19 DIAGNOSIS — K219 Gastro-esophageal reflux disease without esophagitis: Secondary | ICD-10-CM | POA: Diagnosis not present

## 2017-07-19 DIAGNOSIS — J42 Unspecified chronic bronchitis: Secondary | ICD-10-CM | POA: Diagnosis not present

## 2017-07-23 DIAGNOSIS — R001 Bradycardia, unspecified: Secondary | ICD-10-CM | POA: Diagnosis not present

## 2017-07-23 DIAGNOSIS — Z79899 Other long term (current) drug therapy: Secondary | ICD-10-CM | POA: Diagnosis not present

## 2017-07-23 DIAGNOSIS — F419 Anxiety disorder, unspecified: Secondary | ICD-10-CM | POA: Diagnosis not present

## 2017-07-23 DIAGNOSIS — I440 Atrioventricular block, first degree: Secondary | ICD-10-CM | POA: Diagnosis not present

## 2017-07-24 DIAGNOSIS — M79675 Pain in left toe(s): Secondary | ICD-10-CM | POA: Diagnosis not present

## 2017-07-24 DIAGNOSIS — N183 Chronic kidney disease, stage 3 (moderate): Secondary | ICD-10-CM | POA: Diagnosis not present

## 2017-07-24 DIAGNOSIS — B351 Tinea unguium: Secondary | ICD-10-CM | POA: Diagnosis not present

## 2017-07-24 DIAGNOSIS — E1122 Type 2 diabetes mellitus with diabetic chronic kidney disease: Secondary | ICD-10-CM | POA: Diagnosis not present

## 2017-07-24 DIAGNOSIS — E1329 Other specified diabetes mellitus with other diabetic kidney complication: Secondary | ICD-10-CM | POA: Diagnosis not present

## 2017-07-24 DIAGNOSIS — L84 Corns and callosities: Secondary | ICD-10-CM | POA: Diagnosis not present

## 2017-07-24 DIAGNOSIS — F339 Major depressive disorder, recurrent, unspecified: Secondary | ICD-10-CM | POA: Diagnosis not present

## 2017-07-25 DIAGNOSIS — E7849 Other hyperlipidemia: Secondary | ICD-10-CM | POA: Diagnosis not present

## 2017-07-25 DIAGNOSIS — Z79899 Other long term (current) drug therapy: Secondary | ICD-10-CM | POA: Diagnosis not present

## 2017-07-25 DIAGNOSIS — D518 Other vitamin B12 deficiency anemias: Secondary | ICD-10-CM | POA: Diagnosis not present

## 2017-07-25 DIAGNOSIS — E119 Type 2 diabetes mellitus without complications: Secondary | ICD-10-CM | POA: Diagnosis not present

## 2017-07-27 DIAGNOSIS — G4733 Obstructive sleep apnea (adult) (pediatric): Secondary | ICD-10-CM | POA: Diagnosis not present

## 2017-07-31 ENCOUNTER — Telehealth: Payer: Self-pay | Admitting: Cardiovascular Disease

## 2017-07-31 ENCOUNTER — Encounter (INDEPENDENT_AMBULATORY_CARE_PROVIDER_SITE_OTHER): Payer: Self-pay

## 2017-07-31 ENCOUNTER — Encounter: Payer: Self-pay | Admitting: *Deleted

## 2017-07-31 ENCOUNTER — Ambulatory Visit (INDEPENDENT_AMBULATORY_CARE_PROVIDER_SITE_OTHER): Payer: Medicare Other | Admitting: Cardiovascular Disease

## 2017-07-31 ENCOUNTER — Encounter: Payer: Self-pay | Admitting: Cardiovascular Disease

## 2017-07-31 VITALS — BP 124/48 | HR 65 | Ht 61.0 in | Wt 213.0 lb

## 2017-07-31 DIAGNOSIS — R9431 Abnormal electrocardiogram [ECG] [EKG]: Secondary | ICD-10-CM | POA: Diagnosis not present

## 2017-07-31 DIAGNOSIS — R600 Localized edema: Secondary | ICD-10-CM

## 2017-07-31 DIAGNOSIS — R0609 Other forms of dyspnea: Secondary | ICD-10-CM | POA: Diagnosis not present

## 2017-07-31 DIAGNOSIS — I10 Essential (primary) hypertension: Secondary | ICD-10-CM | POA: Diagnosis not present

## 2017-07-31 NOTE — Telephone Encounter (Signed)
Pre-cert Verification for the following procedure   Echo scheduled for 08-08-17 Lexiscan scheduled for 08-27-17

## 2017-07-31 NOTE — Progress Notes (Signed)
CARDIOLOGY CONSULT NOTE  Patient ID: Diana Colon MRN: [id number] DOB/AGE: [birth year] 83 y.o.  Admit date: (Not on file) Primary Physician: Susy Frizzle, MD Referring Physician: Dr. Ronne Binning  Reason for Consultation: Abnormal ECG  HPI: Diana Colon is a 83 y.o. female who is being seen today for the evaluation of an abnormal ECG at the request of Pablo Ledger, MD.   I have personally reviewed all documentation, labs, radiographic and cardiovascular studies, and independently interpreted all ECG's.  Past medical history is significant for type 2 diabetes, hypertension, chronic kidney disease, obstructive sleep apnea, and obesity.  Tracings reviewed from the [facility] demonstrated sinus rhythm with first-degree AV block and nonspecific ST segment and T wave abnormalities in leads I and aVL.  She is here with her daughter.  She started developing exertional dyspnea about a year ago.  She says it has remained stable.  She does not get much activity.  She denies chest pain and tightness.  She said she has asthma.  She quit smoking over 50 years ago.  She has been using 2 L of oxygen continuously.  She denies orthopnea paroxysmal nocturnal dyspnea.  Dr. Ronne Binning is trying to help her get CPAP.  Echocardiogram 04/23/13: Normal left ventricular systolic function and regional wall motion, LVEF 60%, mild LVH, diastolic dysfunction, mild left atrial enlargement, and aortic valve sclerosis without stenosis.     Allergies  Allergen Reactions  . Ace Inhibitors Swelling  . Codeine Nausea And Vomiting  . Metformin And Related Diarrhea    Current Outpatient Prescriptions  Medication Sig Dispense Refill  . ALPRAZolam (XANAX) 0.5 MG tablet Take 0.5 mg by mouth at bedtime as needed for anxiety.    Marland Kitchen aspirin 325 MG tablet Take 325 mg by mouth daily.    . barrier cream (NON-SPECIFIED) CREA Apply 1 application topically 2 (two) times daily.    . blood glucose meter kit and supplies  KIT Dispense based on patient and insurance preference. Use up to four times daily as directed. (FOR ICD-9 250.00, 250.01). 1 each 0  . busPIRone (BUSPAR) 5 MG tablet Take 5 mg by mouth 2 (two) times daily.    . cloNIDine (CATAPRES) 0.1 MG tablet Take 0.1 mg by mouth 2 (two) times daily.    . Coenzyme Q10 (CO Q 10 PO) Take 1 tablet by mouth daily.    Marland Kitchen CORAL CALCIUM PO Take 1 tablet by mouth daily.    . Cyanocobalamin (B-12 PO) Place 1 tablet under the tongue daily.    . diazepam (VALIUM) 2 MG tablet Take 2 mg by mouth every 12 (twelve) hours as needed for anxiety.    Marland Kitchen escitalopram (LEXAPRO) 10 MG tablet TAKE ONE TABLET BY MOUTH DAILY. 30 tablet 5  . Fluticasone-Salmeterol (ADVAIR) 250-50 MCG/DOSE AEPB Inhale 1 puff into the lungs 2 (two) times daily.    . hydrALAZINE (APRESOLINE) 25 MG tablet Take 25 mg by mouth 3 (three) times daily.    . hydrOXYzine (ATARAX/VISTARIL) 25 MG tablet Take 25 mg by mouth at bedtime.    . insulin aspart (NOVOLOG) 100 UNIT/ML injection Inject 5 Units into the skin 3 (three) times daily with meals. 10 mL 11  . insulin detemir (LEVEMIR) 100 UNIT/ML injection Inject 100 Units into the skin at bedtime.    . Insulin Glargine (LANTUS SOLOSTAR) 100 UNIT/ML Solostar Pen INJECT 52 UNITS SUBCUTANEOUSLY AT BEDTIME. (Patient taking differently: Inject 50 Units into the skin daily. ) 45  mL 2  . Lancets 30G MISC 1 each by Does not apply route 2 (two) times daily. 100 each 11  . loratadine (CLARITIN) 10 MG tablet Take 10 mg by mouth daily.    Marland Kitchen LORazepam (ATIVAN) 0.5 MG tablet Take 0.5 mg by mouth every 8 (eight) hours.    Marland Kitchen nystatin cream (MYCOSTATIN) Apply 1 application topically 2 (two) times daily. Apply to buttocks and perineum    . omeprazole (PRILOSEC) 40 MG capsule Take 1 capsule (40 mg total) by mouth daily. 30 capsule 11  . promethazine (PHENERGAN) 25 MG tablet Take 25 mg by mouth every 6 (six) hours as needed for nausea or vomiting.    . ranitidine (ZANTAC) 150 MG  tablet Take 150 mg by mouth at bedtime.    Marland Kitchen rOPINIRole (REQUIP) 1 MG tablet Take 1 mg by mouth at bedtime.    . sitaGLIPtin (JANUVIA) 100 MG tablet Take 1 tablet (100 mg total) by mouth daily. 30 tablet 2  . torsemide (DEMADEX) 20 MG tablet Take 60 mg by mouth daily.    . valsartan (DIOVAN) 320 MG tablet Take 1 tablet (320 mg total) by mouth daily. 30 tablet 0   No current facility-administered medications for this visit.     Past Medical History:  Diagnosis Date  . Depression   . Diabetes mellitus without complication ([place])   . Diabetic retinopathy ([place])   . DJD (degenerative joint disease) of knee   . Hyperlipidemia   . Hypertension   . Insomnia   . OSA on CPAP    10 cwp    Past Surgical History:  Procedure Laterality Date  . APPENDECTOMY    . EYE SURGERY      Social History   Social History  . Marital status: Divorced    Spouse name: N/A  . Number of children: N/A  . Years of education: N/A   Occupational History  . Not on file.   Social History Main Topics  . Smoking status: Former Smoker    Types: Cigarettes  . Smokeless tobacco: Never Used  . Alcohol use No  . Drug use: No  . Sexual activity: Not on file   Other Topics Concern  . Not on file   Social History Narrative  . No narrative on file     No family history of premature CAD in 1st degree relatives.  Current Meds  Medication Sig  . ALPRAZolam (XANAX) 0.5 MG tablet Take 0.5 mg by mouth at bedtime as needed for anxiety.  Marland Kitchen aspirin 325 MG tablet Take 325 mg by mouth daily.  . barrier cream (NON-SPECIFIED) CREA Apply 1 application topically 2 (two) times daily.  . blood glucose meter kit and supplies KIT Dispense based on patient and insurance preference. Use up to four times daily as directed. (FOR ICD-9 250.00, 250.01).  . busPIRone (BUSPAR) 5 MG tablet Take 5 mg by mouth 2 (two) times daily.  . cloNIDine (CATAPRES) 0.1 MG tablet Take 0.1 mg by mouth 2 (two) times daily.  . Coenzyme Q10 (CO Q  10 PO) Take 1 tablet by mouth daily.  Marland Kitchen CORAL CALCIUM PO Take 1 tablet by mouth daily.  . Cyanocobalamin (B-12 PO) Place 1 tablet under the tongue daily.  . diazepam (VALIUM) 2 MG tablet Take 2 mg by mouth every 12 (twelve) hours as needed for anxiety.  Marland Kitchen escitalopram (LEXAPRO) 10 MG tablet TAKE ONE TABLET BY MOUTH DAILY.  Marland Kitchen Fluticasone-Salmeterol (ADVAIR) 250-50 MCG/DOSE AEPB Inhale 1 puff into  the lungs 2 (two) times daily.  . hydrALAZINE (APRESOLINE) 25 MG tablet Take 25 mg by mouth 3 (three) times daily.  . hydrOXYzine (ATARAX/VISTARIL) 25 MG tablet Take 25 mg by mouth at bedtime.  . insulin aspart (NOVOLOG) 100 UNIT/ML injection Inject 5 Units into the skin 3 (three) times daily with meals.  . insulin detemir (LEVEMIR) 100 UNIT/ML injection Inject 100 Units into the skin at bedtime.  . Insulin Glargine (LANTUS SOLOSTAR) 100 UNIT/ML Solostar Pen INJECT 52 UNITS SUBCUTANEOUSLY AT BEDTIME. (Patient taking differently: Inject 50 Units into the skin daily. )  . Lancets 30G MISC 1 each by Does not apply route 2 (two) times daily.  Marland Kitchen loratadine (CLARITIN) 10 MG tablet Take 10 mg by mouth daily.  Marland Kitchen LORazepam (ATIVAN) 0.5 MG tablet Take 0.5 mg by mouth every 8 (eight) hours.  Marland Kitchen nystatin cream (MYCOSTATIN) Apply 1 application topically 2 (two) times daily. Apply to buttocks and perineum  . omeprazole (PRILOSEC) 40 MG capsule Take 1 capsule (40 mg total) by mouth daily.  . promethazine (PHENERGAN) 25 MG tablet Take 25 mg by mouth every 6 (six) hours as needed for nausea or vomiting.  . ranitidine (ZANTAC) 150 MG tablet Take 150 mg by mouth at bedtime.  Marland Kitchen rOPINIRole (REQUIP) 1 MG tablet Take 1 mg by mouth at bedtime.  . sitaGLIPtin (JANUVIA) 100 MG tablet Take 1 tablet (100 mg total) by mouth daily.  Marland Kitchen torsemide (DEMADEX) 20 MG tablet Take 60 mg by mouth daily.  . valsartan (DIOVAN) 320 MG tablet Take 1 tablet (320 mg total) by mouth daily.      Review of systems complete and found to be negative  unless listed above in HPI    Physical exam Blood pressure (!) 124/48, pulse 65, height _0  (1.549 m), weight 213 lb (96.6 kg), SpO2 99 %. General: NAD Neck: No JVD, no thyromegaly or thyroid nodule.  Lungs: Clear to auscultation bilaterally with normal respiratory effort. CV: Nondisplaced PMI. Regular rate and rhythm, normal S1/S2, no S3/S4, 2/6 ejection systolic murmur most prominent over right upper sternal border.  No peripheral edema.  No carotid bruit.    Abdomen: Soft, nontender, no distention, protuberant.  Skin: Intact without lesions or rashes.  Neurologic: Alert and oriented x 3.  Psych: Normal affect. Extremities: No clubbing or cyanosis.  HEENT: Normal.   ECG: Most recent ECG reviewed.   Labs: Lab Results  Component Value Date/Time   K 3.8 03/28/2016 05:41 AM   BUN 25 (H) 03/28/2016 05:41 AM   CREATININE 1.04 (H) 03/28/2016 05:41 AM   CREATININE 1.27 (H) 12/24/2015 02:23 PM   ALT 57 (H) 03/26/2016 05:45 AM   TSH 1.254 03/24/2016 08:49 AM   HGB 9.5 (L) 03/26/2016 05:45 AM     Lipids: Lab Results  Component Value Date/Time   LDLCALC 109 (H) 09/12/2013 10:35 AM   CHOL 187 09/12/2013 10:35 AM   TRIG 104 09/12/2013 10:35 AM   HDL 57 09/12/2013 10:35 AM        ASSESSMENT AND PLAN:  1.  Exertional dyspnea with abnormal ECG: She does not get much physical activity.  She has numerous cardiovascular risk factors.  I will proceed with a nuclear myocardial perfusion imaging study to evaluate for ischemic heart disease (Lexiscan Myoview). I will order a 2-D echocardiogram with Doppler to evaluate cardiac structure, function, and regional wall motion. I will try to obtain labs from her PCP.  2.  Hypertension: Controlled.  No changes.  3.  Bilateral leg edema: She normally wears compression stockings.  She takes torsemide.   Disposition: Follow up in 6 weeks   Signed: Kate Sable, M.D., F.A.C.C.  07/31/2017, 1:48 PM

## 2017-07-31 NOTE — Patient Instructions (Addendum)
Medication Instructions:  Continue all current medications.  Labwork: none  Testing/Procedures:  Your physician has requested that you have an echocardiogram. Echocardiography is a painless test that uses sound waves to create images of your heart. It provides your doctor with information about the size and shape of your heart and how well your heart's chambers and valves are working. This procedure takes approximately one hour. There are no restrictions for this procedure.  Your physician has requested that you have a lexiscan myoview. For further information please visit www.[email or web address]. Please follow instruction sheet, as given.  Office will contact with results via phone or letter.    Follow-Up: 6 weeks   Any Other Special Instructions Will Be Listed Below (If Applicable).  If you need a refill on your cardiac medications before your next appointment, please call your pharmacy.  

## 2017-08-01 DIAGNOSIS — Z79899 Other long term (current) drug therapy: Secondary | ICD-10-CM | POA: Diagnosis not present

## 2017-08-01 DIAGNOSIS — E7849 Other hyperlipidemia: Secondary | ICD-10-CM | POA: Diagnosis not present

## 2017-08-01 DIAGNOSIS — E119 Type 2 diabetes mellitus without complications: Secondary | ICD-10-CM | POA: Diagnosis not present

## 2017-08-01 DIAGNOSIS — D518 Other vitamin B12 deficiency anemias: Secondary | ICD-10-CM | POA: Diagnosis not present

## 2017-08-08 ENCOUNTER — Ambulatory Visit (INDEPENDENT_AMBULATORY_CARE_PROVIDER_SITE_OTHER): Payer: Medicare Other

## 2017-08-08 ENCOUNTER — Other Ambulatory Visit: Payer: Self-pay

## 2017-08-08 DIAGNOSIS — R0609 Other forms of dyspnea: Secondary | ICD-10-CM | POA: Diagnosis not present

## 2017-08-13 DIAGNOSIS — I10 Essential (primary) hypertension: Secondary | ICD-10-CM | POA: Diagnosis not present

## 2017-08-13 DIAGNOSIS — R252 Cramp and spasm: Secondary | ICD-10-CM | POA: Diagnosis not present

## 2017-08-15 DIAGNOSIS — E1122 Type 2 diabetes mellitus with diabetic chronic kidney disease: Secondary | ICD-10-CM | POA: Diagnosis not present

## 2017-08-15 DIAGNOSIS — G4733 Obstructive sleep apnea (adult) (pediatric): Secondary | ICD-10-CM | POA: Diagnosis not present

## 2017-08-15 DIAGNOSIS — J42 Unspecified chronic bronchitis: Secondary | ICD-10-CM | POA: Diagnosis not present

## 2017-08-15 DIAGNOSIS — N183 Chronic kidney disease, stage 3 (moderate): Secondary | ICD-10-CM | POA: Diagnosis not present

## 2017-08-20 DIAGNOSIS — M6281 Muscle weakness (generalized): Secondary | ICD-10-CM | POA: Diagnosis not present

## 2017-08-20 DIAGNOSIS — N183 Chronic kidney disease, stage 3 (moderate): Secondary | ICD-10-CM | POA: Diagnosis not present

## 2017-08-20 DIAGNOSIS — E1165 Type 2 diabetes mellitus with hyperglycemia: Secondary | ICD-10-CM | POA: Diagnosis not present

## 2017-08-20 DIAGNOSIS — F39 Unspecified mood [affective] disorder: Secondary | ICD-10-CM | POA: Diagnosis not present

## 2017-08-20 DIAGNOSIS — R296 Repeated falls: Secondary | ICD-10-CM | POA: Diagnosis not present

## 2017-08-21 DIAGNOSIS — M6281 Muscle weakness (generalized): Secondary | ICD-10-CM | POA: Diagnosis not present

## 2017-08-21 DIAGNOSIS — E1165 Type 2 diabetes mellitus with hyperglycemia: Secondary | ICD-10-CM | POA: Diagnosis not present

## 2017-08-21 DIAGNOSIS — F39 Unspecified mood [affective] disorder: Secondary | ICD-10-CM | POA: Diagnosis not present

## 2017-08-21 DIAGNOSIS — R296 Repeated falls: Secondary | ICD-10-CM | POA: Diagnosis not present

## 2017-08-21 DIAGNOSIS — N183 Chronic kidney disease, stage 3 (moderate): Secondary | ICD-10-CM | POA: Diagnosis not present

## 2017-08-23 DIAGNOSIS — E1165 Type 2 diabetes mellitus with hyperglycemia: Secondary | ICD-10-CM | POA: Diagnosis not present

## 2017-08-23 DIAGNOSIS — N183 Chronic kidney disease, stage 3 (moderate): Secondary | ICD-10-CM | POA: Diagnosis not present

## 2017-08-23 DIAGNOSIS — F39 Unspecified mood [affective] disorder: Secondary | ICD-10-CM | POA: Diagnosis not present

## 2017-08-23 DIAGNOSIS — M6281 Muscle weakness (generalized): Secondary | ICD-10-CM | POA: Diagnosis not present

## 2017-08-23 DIAGNOSIS — R296 Repeated falls: Secondary | ICD-10-CM | POA: Diagnosis not present

## 2017-08-24 DIAGNOSIS — N183 Chronic kidney disease, stage 3 (moderate): Secondary | ICD-10-CM | POA: Diagnosis not present

## 2017-08-24 DIAGNOSIS — F39 Unspecified mood [affective] disorder: Secondary | ICD-10-CM | POA: Diagnosis not present

## 2017-08-24 DIAGNOSIS — M6281 Muscle weakness (generalized): Secondary | ICD-10-CM | POA: Diagnosis not present

## 2017-08-24 DIAGNOSIS — E1165 Type 2 diabetes mellitus with hyperglycemia: Secondary | ICD-10-CM | POA: Diagnosis not present

## 2017-08-24 DIAGNOSIS — R296 Repeated falls: Secondary | ICD-10-CM | POA: Diagnosis not present

## 2017-08-27 ENCOUNTER — Encounter (HOSPITAL_BASED_OUTPATIENT_CLINIC_OR_DEPARTMENT_OTHER)
Admission: RE | Admit: 2017-08-27 | Discharge: 2017-08-27 | Disposition: A | Discharge status: Home / Self Care | Payer: Medicare Other | Source: Ambulatory Visit | Attending: Cardiovascular Disease | Admitting: Cardiovascular Disease

## 2017-08-27 ENCOUNTER — Encounter (HOSPITAL_COMMUNITY)
Admission: RE | Admit: 2017-08-27 | Discharge: 2017-08-27 | Disposition: A | Discharge status: Home / Self Care | Payer: Medicare Other | Source: Ambulatory Visit | Attending: Cardiovascular Disease | Admitting: Cardiovascular Disease

## 2017-08-27 ENCOUNTER — Encounter (HOSPITAL_COMMUNITY): Payer: Self-pay

## 2017-08-27 DIAGNOSIS — F39 Unspecified mood [affective] disorder: Secondary | ICD-10-CM | POA: Diagnosis not present

## 2017-08-27 DIAGNOSIS — R0609 Other forms of dyspnea: Secondary | ICD-10-CM | POA: Diagnosis not present

## 2017-08-27 DIAGNOSIS — R296 Repeated falls: Secondary | ICD-10-CM | POA: Diagnosis not present

## 2017-08-27 DIAGNOSIS — E1165 Type 2 diabetes mellitus with hyperglycemia: Secondary | ICD-10-CM | POA: Diagnosis not present

## 2017-08-27 DIAGNOSIS — M6281 Muscle weakness (generalized): Secondary | ICD-10-CM | POA: Diagnosis not present

## 2017-08-27 DIAGNOSIS — N183 Chronic kidney disease, stage 3 (moderate): Secondary | ICD-10-CM | POA: Diagnosis not present

## 2017-08-27 HISTORY — DX: Unspecified asthma, uncomplicated: J45.909

## 2017-08-27 LAB — NM MYOCAR MULTI W/SPECT W/WALL MOTION / EF
CHL CUP NUCLEAR SDS: 9
CHL CUP NUCLEAR SRS: 9
CHL CUP NUCLEAR SSS: 18
CSEPPHR: 82 {beats}/min
LHR: 0.22
LV dias vol: 99 mL (ref 46–106)
LV sys vol: 32 mL
Rest HR: 53 {beats}/min
TID: 0.91

## 2017-08-27 MED ORDER — TECHNETIUM TC 99M TETROFOSMIN IV KIT
30.0000 | PACK | Freq: Once | INTRAVENOUS | Status: AC | PRN
  Administered 2017-08-27: 32 via INTRAVENOUS

## 2017-08-27 MED ORDER — TECHNETIUM TC 99M TETROFOSMIN IV KIT
10.0000 | PACK | Freq: Once | INTRAVENOUS | Status: AC | PRN
  Administered 2017-08-27: 11 via INTRAVENOUS

## 2017-08-27 MED ORDER — REGADENOSON 0.4 MG/5ML IV SOLN
INTRAVENOUS | Status: AC
  Administered 2017-08-27: 0.4 mg via INTRAVENOUS
  Filled 2017-08-27: qty 5

## 2017-08-27 MED ORDER — SODIUM CHLORIDE 0.9% FLUSH
INTRAVENOUS | Status: AC
  Administered 2017-08-27: 10 mL via INTRAVENOUS
  Filled 2017-08-27: qty 10

## 2017-08-28 ENCOUNTER — Telehealth: Payer: Self-pay | Admitting: *Deleted

## 2017-08-28 DIAGNOSIS — R296 Repeated falls: Secondary | ICD-10-CM | POA: Diagnosis not present

## 2017-08-28 DIAGNOSIS — M6281 Muscle weakness (generalized): Secondary | ICD-10-CM | POA: Diagnosis not present

## 2017-08-28 DIAGNOSIS — N183 Chronic kidney disease, stage 3 (moderate): Secondary | ICD-10-CM | POA: Diagnosis not present

## 2017-08-28 DIAGNOSIS — F39 Unspecified mood [affective] disorder: Secondary | ICD-10-CM | POA: Diagnosis not present

## 2017-08-28 DIAGNOSIS — E1165 Type 2 diabetes mellitus with hyperglycemia: Secondary | ICD-10-CM | POA: Diagnosis not present

## 2017-08-28 NOTE — Telephone Encounter (Signed)
Notes recorded by Laurine Blazer, LPN on 33/43/5686 at 3:22 PM EST Gina Axom notified at the [facility]. Will fwd to [place]. Follow up scheduled for 08/31/2017 with Dr. Bronson [facility] office. ------  Notes recorded by Herminio Commons, MD on 08/27/2017 at 3:58 PM EST Low risk.

## 2017-08-29 DIAGNOSIS — F39 Unspecified mood [affective] disorder: Secondary | ICD-10-CM | POA: Diagnosis not present

## 2017-08-29 DIAGNOSIS — N183 Chronic kidney disease, stage 3 (moderate): Secondary | ICD-10-CM | POA: Diagnosis not present

## 2017-08-29 DIAGNOSIS — E1165 Type 2 diabetes mellitus with hyperglycemia: Secondary | ICD-10-CM | POA: Diagnosis not present

## 2017-08-29 DIAGNOSIS — M6281 Muscle weakness (generalized): Secondary | ICD-10-CM | POA: Diagnosis not present

## 2017-08-29 DIAGNOSIS — R296 Repeated falls: Secondary | ICD-10-CM | POA: Diagnosis not present

## 2017-08-30 DIAGNOSIS — R402431 Glasgow coma scale score 3-8, in the field [EMT or ambulance]: Secondary | ICD-10-CM | POA: Diagnosis not present

## 2017-08-30 DIAGNOSIS — R4182 Altered mental status, unspecified: Secondary | ICD-10-CM | POA: Diagnosis not present

## 2017-08-30 DIAGNOSIS — R531 Weakness: Secondary | ICD-10-CM | POA: Diagnosis not present

## 2017-08-30 DIAGNOSIS — E860 Dehydration: Secondary | ICD-10-CM | POA: Diagnosis not present

## 2017-08-30 DIAGNOSIS — I129 Hypertensive chronic kidney disease with stage 1 through stage 4 chronic kidney disease, or unspecified chronic kidney disease: Secondary | ICD-10-CM | POA: Diagnosis not present

## 2017-08-30 DIAGNOSIS — I517 Cardiomegaly: Secondary | ICD-10-CM | POA: Diagnosis not present

## 2017-08-30 DIAGNOSIS — J9610 Chronic respiratory failure, unspecified whether with hypoxia or hypercapnia: Secondary | ICD-10-CM | POA: Diagnosis not present

## 2017-08-30 DIAGNOSIS — R51 Headache: Secondary | ICD-10-CM | POA: Diagnosis not present

## 2017-08-30 DIAGNOSIS — N183 Chronic kidney disease, stage 3 (moderate): Secondary | ICD-10-CM | POA: Diagnosis not present

## 2017-08-30 DIAGNOSIS — R112 Nausea with vomiting, unspecified: Secondary | ICD-10-CM | POA: Diagnosis not present

## 2017-08-30 DIAGNOSIS — G4489 Other headache syndrome: Secondary | ICD-10-CM | POA: Diagnosis not present

## 2017-08-31 ENCOUNTER — Ambulatory Visit: Payer: Medicare Other | Admitting: Cardiovascular Disease

## 2017-08-31 DIAGNOSIS — Z8673 Personal history of transient ischemic attack (TIA), and cerebral infarction without residual deficits: Secondary | ICD-10-CM | POA: Diagnosis not present

## 2017-08-31 DIAGNOSIS — Z886 Allergy status to analgesic agent status: Secondary | ICD-10-CM | POA: Diagnosis not present

## 2017-08-31 DIAGNOSIS — J9610 Chronic respiratory failure, unspecified whether with hypoxia or hypercapnia: Secondary | ICD-10-CM | POA: Diagnosis present

## 2017-08-31 DIAGNOSIS — Z91030 Bee allergy status: Secondary | ICD-10-CM | POA: Diagnosis not present

## 2017-08-31 DIAGNOSIS — R112 Nausea with vomiting, unspecified: Secondary | ICD-10-CM | POA: Diagnosis not present

## 2017-08-31 DIAGNOSIS — E119 Type 2 diabetes mellitus without complications: Secondary | ICD-10-CM | POA: Diagnosis not present

## 2017-08-31 DIAGNOSIS — N183 Chronic kidney disease, stage 3 (moderate): Secondary | ICD-10-CM | POA: Diagnosis present

## 2017-08-31 DIAGNOSIS — Z801 Family history of malignant neoplasm of trachea, bronchus and lung: Secondary | ICD-10-CM | POA: Diagnosis not present

## 2017-08-31 DIAGNOSIS — G4730 Sleep apnea, unspecified: Secondary | ICD-10-CM | POA: Diagnosis present

## 2017-08-31 DIAGNOSIS — Z8249 Family history of ischemic heart disease and other diseases of the circulatory system: Secondary | ICD-10-CM | POA: Diagnosis not present

## 2017-08-31 DIAGNOSIS — E1122 Type 2 diabetes mellitus with diabetic chronic kidney disease: Secondary | ICD-10-CM | POA: Diagnosis present

## 2017-08-31 DIAGNOSIS — Z823 Family history of stroke: Secondary | ICD-10-CM | POA: Diagnosis not present

## 2017-08-31 DIAGNOSIS — Z800 Family history of malignant neoplasm of digestive organs: Secondary | ICD-10-CM | POA: Diagnosis not present

## 2017-08-31 DIAGNOSIS — I129 Hypertensive chronic kidney disease with stage 1 through stage 4 chronic kidney disease, or unspecified chronic kidney disease: Secondary | ICD-10-CM | POA: Diagnosis present

## 2017-08-31 DIAGNOSIS — D649 Anemia, unspecified: Secondary | ICD-10-CM | POA: Diagnosis not present

## 2017-08-31 DIAGNOSIS — I10 Essential (primary) hypertension: Secondary | ICD-10-CM | POA: Diagnosis not present

## 2017-08-31 DIAGNOSIS — Z9981 Dependence on supplemental oxygen: Secondary | ICD-10-CM | POA: Diagnosis not present

## 2017-08-31 DIAGNOSIS — F319 Bipolar disorder, unspecified: Secondary | ICD-10-CM | POA: Diagnosis present

## 2017-08-31 DIAGNOSIS — Z87891 Personal history of nicotine dependence: Secondary | ICD-10-CM | POA: Diagnosis not present

## 2017-08-31 DIAGNOSIS — J45909 Unspecified asthma, uncomplicated: Secondary | ICD-10-CM | POA: Diagnosis present

## 2017-08-31 DIAGNOSIS — D638 Anemia in other chronic diseases classified elsewhere: Secondary | ICD-10-CM | POA: Diagnosis present

## 2017-08-31 DIAGNOSIS — E860 Dehydration: Secondary | ICD-10-CM | POA: Diagnosis not present

## 2017-08-31 DIAGNOSIS — F0390 Unspecified dementia without behavioral disturbance: Secondary | ICD-10-CM | POA: Diagnosis present

## 2017-08-31 DIAGNOSIS — R51 Headache: Secondary | ICD-10-CM | POA: Diagnosis not present

## 2017-08-31 DIAGNOSIS — Z833 Family history of diabetes mellitus: Secondary | ICD-10-CM | POA: Diagnosis not present

## 2017-08-31 DIAGNOSIS — R1110 Vomiting, unspecified: Secondary | ICD-10-CM | POA: Diagnosis not present

## 2017-08-31 DIAGNOSIS — M1990 Unspecified osteoarthritis, unspecified site: Secondary | ICD-10-CM | POA: Diagnosis present

## 2017-09-03 DIAGNOSIS — R1110 Vomiting, unspecified: Secondary | ICD-10-CM | POA: Diagnosis not present

## 2017-09-03 DIAGNOSIS — F339 Major depressive disorder, recurrent, unspecified: Secondary | ICD-10-CM | POA: Diagnosis not present

## 2017-09-03 DIAGNOSIS — N183 Chronic kidney disease, stage 3 (moderate): Secondary | ICD-10-CM | POA: Diagnosis not present

## 2017-09-03 DIAGNOSIS — E860 Dehydration: Secondary | ICD-10-CM | POA: Diagnosis not present

## 2017-09-03 DIAGNOSIS — M6281 Muscle weakness (generalized): Secondary | ICD-10-CM | POA: Diagnosis not present

## 2017-09-05 DIAGNOSIS — R112 Nausea with vomiting, unspecified: Secondary | ICD-10-CM | POA: Diagnosis not present

## 2017-09-06 DIAGNOSIS — N183 Chronic kidney disease, stage 3 (moderate): Secondary | ICD-10-CM | POA: Diagnosis not present

## 2017-09-06 DIAGNOSIS — E1165 Type 2 diabetes mellitus with hyperglycemia: Secondary | ICD-10-CM | POA: Diagnosis not present

## 2017-09-06 DIAGNOSIS — R296 Repeated falls: Secondary | ICD-10-CM | POA: Diagnosis not present

## 2017-09-06 DIAGNOSIS — F39 Unspecified mood [affective] disorder: Secondary | ICD-10-CM | POA: Diagnosis not present

## 2017-09-06 DIAGNOSIS — M6281 Muscle weakness (generalized): Secondary | ICD-10-CM | POA: Diagnosis not present

## 2017-09-07 DIAGNOSIS — E1165 Type 2 diabetes mellitus with hyperglycemia: Secondary | ICD-10-CM | POA: Diagnosis not present

## 2017-09-07 DIAGNOSIS — M6281 Muscle weakness (generalized): Secondary | ICD-10-CM | POA: Diagnosis not present

## 2017-09-07 DIAGNOSIS — R296 Repeated falls: Secondary | ICD-10-CM | POA: Diagnosis not present

## 2017-09-07 DIAGNOSIS — F39 Unspecified mood [affective] disorder: Secondary | ICD-10-CM | POA: Diagnosis not present

## 2017-09-07 DIAGNOSIS — N183 Chronic kidney disease, stage 3 (moderate): Secondary | ICD-10-CM | POA: Diagnosis not present

## 2017-09-11 DIAGNOSIS — N183 Chronic kidney disease, stage 3 (moderate): Secondary | ICD-10-CM | POA: Diagnosis not present

## 2017-09-11 DIAGNOSIS — R296 Repeated falls: Secondary | ICD-10-CM | POA: Diagnosis not present

## 2017-09-11 DIAGNOSIS — E1165 Type 2 diabetes mellitus with hyperglycemia: Secondary | ICD-10-CM | POA: Diagnosis not present

## 2017-09-11 DIAGNOSIS — F39 Unspecified mood [affective] disorder: Secondary | ICD-10-CM | POA: Diagnosis not present

## 2017-09-11 DIAGNOSIS — G4733 Obstructive sleep apnea (adult) (pediatric): Secondary | ICD-10-CM | POA: Diagnosis not present

## 2017-09-11 DIAGNOSIS — M6281 Muscle weakness (generalized): Secondary | ICD-10-CM | POA: Diagnosis not present

## 2017-09-11 DIAGNOSIS — K219 Gastro-esophageal reflux disease without esophagitis: Secondary | ICD-10-CM | POA: Diagnosis not present

## 2017-09-11 DIAGNOSIS — I10 Essential (primary) hypertension: Secondary | ICD-10-CM | POA: Diagnosis not present

## 2017-09-12 DIAGNOSIS — F39 Unspecified mood [affective] disorder: Secondary | ICD-10-CM | POA: Diagnosis not present

## 2017-09-12 DIAGNOSIS — N183 Chronic kidney disease, stage 3 (moderate): Secondary | ICD-10-CM | POA: Diagnosis not present

## 2017-09-12 DIAGNOSIS — M6281 Muscle weakness (generalized): Secondary | ICD-10-CM | POA: Diagnosis not present

## 2017-09-12 DIAGNOSIS — R296 Repeated falls: Secondary | ICD-10-CM | POA: Diagnosis not present

## 2017-09-12 DIAGNOSIS — E1165 Type 2 diabetes mellitus with hyperglycemia: Secondary | ICD-10-CM | POA: Diagnosis not present

## 2017-09-13 DIAGNOSIS — M6281 Muscle weakness (generalized): Secondary | ICD-10-CM | POA: Diagnosis not present

## 2017-09-13 DIAGNOSIS — N183 Chronic kidney disease, stage 3 (moderate): Secondary | ICD-10-CM | POA: Diagnosis not present

## 2017-09-13 DIAGNOSIS — E1165 Type 2 diabetes mellitus with hyperglycemia: Secondary | ICD-10-CM | POA: Diagnosis not present

## 2017-09-13 DIAGNOSIS — D229 Melanocytic nevi, unspecified: Secondary | ICD-10-CM | POA: Diagnosis not present

## 2017-09-13 DIAGNOSIS — R296 Repeated falls: Secondary | ICD-10-CM | POA: Diagnosis not present

## 2017-09-13 DIAGNOSIS — F39 Unspecified mood [affective] disorder: Secondary | ICD-10-CM | POA: Diagnosis not present

## 2017-09-14 DIAGNOSIS — R296 Repeated falls: Secondary | ICD-10-CM | POA: Diagnosis not present

## 2017-09-14 DIAGNOSIS — R5383 Other fatigue: Secondary | ICD-10-CM | POA: Diagnosis not present

## 2017-09-14 DIAGNOSIS — R7981 Abnormal blood-gas level: Secondary | ICD-10-CM | POA: Diagnosis not present

## 2017-09-14 DIAGNOSIS — R112 Nausea with vomiting, unspecified: Secondary | ICD-10-CM | POA: Diagnosis not present

## 2017-09-14 DIAGNOSIS — N183 Chronic kidney disease, stage 3 (moderate): Secondary | ICD-10-CM | POA: Diagnosis not present

## 2017-09-14 DIAGNOSIS — M6281 Muscle weakness (generalized): Secondary | ICD-10-CM | POA: Diagnosis not present

## 2017-09-14 DIAGNOSIS — R4182 Altered mental status, unspecified: Secondary | ICD-10-CM | POA: Diagnosis not present

## 2017-09-14 DIAGNOSIS — R8290 Unspecified abnormal findings in urine: Secondary | ICD-10-CM | POA: Diagnosis not present

## 2017-09-14 DIAGNOSIS — F39 Unspecified mood [affective] disorder: Secondary | ICD-10-CM | POA: Diagnosis not present

## 2017-09-14 DIAGNOSIS — E1165 Type 2 diabetes mellitus with hyperglycemia: Secondary | ICD-10-CM | POA: Diagnosis not present

## 2017-09-15 DIAGNOSIS — M6281 Muscle weakness (generalized): Secondary | ICD-10-CM | POA: Diagnosis not present

## 2017-09-15 DIAGNOSIS — N183 Chronic kidney disease, stage 3 (moderate): Secondary | ICD-10-CM | POA: Diagnosis not present

## 2017-09-15 DIAGNOSIS — R296 Repeated falls: Secondary | ICD-10-CM | POA: Diagnosis not present

## 2017-09-15 DIAGNOSIS — E1165 Type 2 diabetes mellitus with hyperglycemia: Secondary | ICD-10-CM | POA: Diagnosis not present

## 2017-09-15 DIAGNOSIS — F39 Unspecified mood [affective] disorder: Secondary | ICD-10-CM | POA: Diagnosis not present

## 2017-09-17 DIAGNOSIS — E1165 Type 2 diabetes mellitus with hyperglycemia: Secondary | ICD-10-CM | POA: Diagnosis not present

## 2017-09-17 DIAGNOSIS — N183 Chronic kidney disease, stage 3 (moderate): Secondary | ICD-10-CM | POA: Diagnosis not present

## 2017-09-17 DIAGNOSIS — M6281 Muscle weakness (generalized): Secondary | ICD-10-CM | POA: Diagnosis not present

## 2017-09-17 DIAGNOSIS — R296 Repeated falls: Secondary | ICD-10-CM | POA: Diagnosis not present

## 2017-09-17 DIAGNOSIS — F39 Unspecified mood [affective] disorder: Secondary | ICD-10-CM | POA: Diagnosis not present

## 2017-09-18 DIAGNOSIS — F39 Unspecified mood [affective] disorder: Secondary | ICD-10-CM | POA: Diagnosis not present

## 2017-09-18 DIAGNOSIS — N183 Chronic kidney disease, stage 3 (moderate): Secondary | ICD-10-CM | POA: Diagnosis not present

## 2017-09-18 DIAGNOSIS — R296 Repeated falls: Secondary | ICD-10-CM | POA: Diagnosis not present

## 2017-09-18 DIAGNOSIS — E1165 Type 2 diabetes mellitus with hyperglycemia: Secondary | ICD-10-CM | POA: Diagnosis not present

## 2017-09-18 DIAGNOSIS — M6281 Muscle weakness (generalized): Secondary | ICD-10-CM | POA: Diagnosis not present

## 2017-09-19 DIAGNOSIS — E1165 Type 2 diabetes mellitus with hyperglycemia: Secondary | ICD-10-CM | POA: Diagnosis not present

## 2017-09-19 DIAGNOSIS — M6281 Muscle weakness (generalized): Secondary | ICD-10-CM | POA: Diagnosis not present

## 2017-09-19 DIAGNOSIS — N183 Chronic kidney disease, stage 3 (moderate): Secondary | ICD-10-CM | POA: Diagnosis not present

## 2017-09-19 DIAGNOSIS — R296 Repeated falls: Secondary | ICD-10-CM | POA: Diagnosis not present

## 2017-09-19 DIAGNOSIS — F39 Unspecified mood [affective] disorder: Secondary | ICD-10-CM | POA: Diagnosis not present

## 2017-09-20 DIAGNOSIS — R296 Repeated falls: Secondary | ICD-10-CM | POA: Diagnosis not present

## 2017-09-20 DIAGNOSIS — M6281 Muscle weakness (generalized): Secondary | ICD-10-CM | POA: Diagnosis not present

## 2017-09-20 DIAGNOSIS — F39 Unspecified mood [affective] disorder: Secondary | ICD-10-CM | POA: Diagnosis not present

## 2017-09-20 DIAGNOSIS — N183 Chronic kidney disease, stage 3 (moderate): Secondary | ICD-10-CM | POA: Diagnosis not present

## 2017-09-20 DIAGNOSIS — E1165 Type 2 diabetes mellitus with hyperglycemia: Secondary | ICD-10-CM | POA: Diagnosis not present

## 2017-09-21 DIAGNOSIS — R296 Repeated falls: Secondary | ICD-10-CM | POA: Diagnosis not present

## 2017-09-21 DIAGNOSIS — N183 Chronic kidney disease, stage 3 (moderate): Secondary | ICD-10-CM | POA: Diagnosis not present

## 2017-09-21 DIAGNOSIS — E1165 Type 2 diabetes mellitus with hyperglycemia: Secondary | ICD-10-CM | POA: Diagnosis not present

## 2017-09-21 DIAGNOSIS — F39 Unspecified mood [affective] disorder: Secondary | ICD-10-CM | POA: Diagnosis not present

## 2017-09-21 DIAGNOSIS — M6281 Muscle weakness (generalized): Secondary | ICD-10-CM | POA: Diagnosis not present

## 2017-09-23 DIAGNOSIS — R296 Repeated falls: Secondary | ICD-10-CM | POA: Diagnosis not present

## 2017-09-23 DIAGNOSIS — F39 Unspecified mood [affective] disorder: Secondary | ICD-10-CM | POA: Diagnosis not present

## 2017-09-23 DIAGNOSIS — M6281 Muscle weakness (generalized): Secondary | ICD-10-CM | POA: Diagnosis not present

## 2017-09-23 DIAGNOSIS — E1165 Type 2 diabetes mellitus with hyperglycemia: Secondary | ICD-10-CM | POA: Diagnosis not present

## 2017-09-23 DIAGNOSIS — N183 Chronic kidney disease, stage 3 (moderate): Secondary | ICD-10-CM | POA: Diagnosis not present

## 2017-09-24 DIAGNOSIS — E1165 Type 2 diabetes mellitus with hyperglycemia: Secondary | ICD-10-CM | POA: Diagnosis not present

## 2017-09-24 DIAGNOSIS — M6281 Muscle weakness (generalized): Secondary | ICD-10-CM | POA: Diagnosis not present

## 2017-09-24 DIAGNOSIS — R296 Repeated falls: Secondary | ICD-10-CM | POA: Diagnosis not present

## 2017-09-24 DIAGNOSIS — F39 Unspecified mood [affective] disorder: Secondary | ICD-10-CM | POA: Diagnosis not present

## 2017-09-24 DIAGNOSIS — N183 Chronic kidney disease, stage 3 (moderate): Secondary | ICD-10-CM | POA: Diagnosis not present

## 2017-09-26 DIAGNOSIS — R296 Repeated falls: Secondary | ICD-10-CM | POA: Diagnosis not present

## 2017-09-26 DIAGNOSIS — E1165 Type 2 diabetes mellitus with hyperglycemia: Secondary | ICD-10-CM | POA: Diagnosis not present

## 2017-09-26 DIAGNOSIS — M6281 Muscle weakness (generalized): Secondary | ICD-10-CM | POA: Diagnosis not present

## 2017-09-26 DIAGNOSIS — N183 Chronic kidney disease, stage 3 (moderate): Secondary | ICD-10-CM | POA: Diagnosis not present

## 2017-09-26 DIAGNOSIS — F39 Unspecified mood [affective] disorder: Secondary | ICD-10-CM | POA: Diagnosis not present

## 2017-09-27 DIAGNOSIS — M6281 Muscle weakness (generalized): Secondary | ICD-10-CM | POA: Diagnosis not present

## 2017-09-27 DIAGNOSIS — N183 Chronic kidney disease, stage 3 (moderate): Secondary | ICD-10-CM | POA: Diagnosis not present

## 2017-09-27 DIAGNOSIS — R296 Repeated falls: Secondary | ICD-10-CM | POA: Diagnosis not present

## 2017-09-27 DIAGNOSIS — E1165 Type 2 diabetes mellitus with hyperglycemia: Secondary | ICD-10-CM | POA: Diagnosis not present

## 2017-09-27 DIAGNOSIS — F39 Unspecified mood [affective] disorder: Secondary | ICD-10-CM | POA: Diagnosis not present

## 2017-09-28 DIAGNOSIS — E1165 Type 2 diabetes mellitus with hyperglycemia: Secondary | ICD-10-CM | POA: Diagnosis not present

## 2017-09-28 DIAGNOSIS — F39 Unspecified mood [affective] disorder: Secondary | ICD-10-CM | POA: Diagnosis not present

## 2017-09-28 DIAGNOSIS — M6281 Muscle weakness (generalized): Secondary | ICD-10-CM | POA: Diagnosis not present

## 2017-09-28 DIAGNOSIS — N183 Chronic kidney disease, stage 3 (moderate): Secondary | ICD-10-CM | POA: Diagnosis not present

## 2017-09-28 DIAGNOSIS — R296 Repeated falls: Secondary | ICD-10-CM | POA: Diagnosis not present

## 2017-10-01 DIAGNOSIS — F39 Unspecified mood [affective] disorder: Secondary | ICD-10-CM | POA: Diagnosis not present

## 2017-10-01 DIAGNOSIS — E1165 Type 2 diabetes mellitus with hyperglycemia: Secondary | ICD-10-CM | POA: Diagnosis not present

## 2017-10-01 DIAGNOSIS — M6281 Muscle weakness (generalized): Secondary | ICD-10-CM | POA: Diagnosis not present

## 2017-10-01 DIAGNOSIS — N183 Chronic kidney disease, stage 3 (moderate): Secondary | ICD-10-CM | POA: Diagnosis not present

## 2017-10-01 DIAGNOSIS — R296 Repeated falls: Secondary | ICD-10-CM | POA: Diagnosis not present

## 2017-10-02 DIAGNOSIS — N183 Chronic kidney disease, stage 3 (moderate): Secondary | ICD-10-CM | POA: Diagnosis not present

## 2017-10-02 DIAGNOSIS — R296 Repeated falls: Secondary | ICD-10-CM | POA: Diagnosis not present

## 2017-10-02 DIAGNOSIS — E1165 Type 2 diabetes mellitus with hyperglycemia: Secondary | ICD-10-CM | POA: Diagnosis not present

## 2017-10-02 DIAGNOSIS — F39 Unspecified mood [affective] disorder: Secondary | ICD-10-CM | POA: Diagnosis not present

## 2017-10-02 DIAGNOSIS — M6281 Muscle weakness (generalized): Secondary | ICD-10-CM | POA: Diagnosis not present

## 2017-10-03 DIAGNOSIS — M6281 Muscle weakness (generalized): Secondary | ICD-10-CM | POA: Diagnosis not present

## 2017-10-03 DIAGNOSIS — F39 Unspecified mood [affective] disorder: Secondary | ICD-10-CM | POA: Diagnosis not present

## 2017-10-03 DIAGNOSIS — R296 Repeated falls: Secondary | ICD-10-CM | POA: Diagnosis not present

## 2017-10-03 DIAGNOSIS — N183 Chronic kidney disease, stage 3 (moderate): Secondary | ICD-10-CM | POA: Diagnosis not present

## 2017-10-03 DIAGNOSIS — E1165 Type 2 diabetes mellitus with hyperglycemia: Secondary | ICD-10-CM | POA: Diagnosis not present

## 2017-10-04 DIAGNOSIS — R296 Repeated falls: Secondary | ICD-10-CM | POA: Diagnosis not present

## 2017-10-04 DIAGNOSIS — M6281 Muscle weakness (generalized): Secondary | ICD-10-CM | POA: Diagnosis not present

## 2017-10-04 DIAGNOSIS — N183 Chronic kidney disease, stage 3 (moderate): Secondary | ICD-10-CM | POA: Diagnosis not present

## 2017-10-04 DIAGNOSIS — F39 Unspecified mood [affective] disorder: Secondary | ICD-10-CM | POA: Diagnosis not present

## 2017-10-04 DIAGNOSIS — E1165 Type 2 diabetes mellitus with hyperglycemia: Secondary | ICD-10-CM | POA: Diagnosis not present

## 2017-10-05 DIAGNOSIS — E1165 Type 2 diabetes mellitus with hyperglycemia: Secondary | ICD-10-CM | POA: Diagnosis not present

## 2017-10-05 DIAGNOSIS — N183 Chronic kidney disease, stage 3 (moderate): Secondary | ICD-10-CM | POA: Diagnosis not present

## 2017-10-05 DIAGNOSIS — F39 Unspecified mood [affective] disorder: Secondary | ICD-10-CM | POA: Diagnosis not present

## 2017-10-05 DIAGNOSIS — M6281 Muscle weakness (generalized): Secondary | ICD-10-CM | POA: Diagnosis not present

## 2017-10-05 DIAGNOSIS — R296 Repeated falls: Secondary | ICD-10-CM | POA: Diagnosis not present

## 2017-10-08 DIAGNOSIS — N183 Chronic kidney disease, stage 3 (moderate): Secondary | ICD-10-CM | POA: Diagnosis not present

## 2017-10-08 DIAGNOSIS — R296 Repeated falls: Secondary | ICD-10-CM | POA: Diagnosis not present

## 2017-10-08 DIAGNOSIS — F39 Unspecified mood [affective] disorder: Secondary | ICD-10-CM | POA: Diagnosis not present

## 2017-10-08 DIAGNOSIS — M6281 Muscle weakness (generalized): Secondary | ICD-10-CM | POA: Diagnosis not present

## 2017-10-08 DIAGNOSIS — E1165 Type 2 diabetes mellitus with hyperglycemia: Secondary | ICD-10-CM | POA: Diagnosis not present

## 2017-10-09 DIAGNOSIS — J42 Unspecified chronic bronchitis: Secondary | ICD-10-CM | POA: Diagnosis not present

## 2017-10-09 DIAGNOSIS — E1165 Type 2 diabetes mellitus with hyperglycemia: Secondary | ICD-10-CM | POA: Diagnosis not present

## 2017-10-09 DIAGNOSIS — E1122 Type 2 diabetes mellitus with diabetic chronic kidney disease: Secondary | ICD-10-CM | POA: Diagnosis not present

## 2017-10-09 DIAGNOSIS — N183 Chronic kidney disease, stage 3 (moderate): Secondary | ICD-10-CM | POA: Diagnosis not present

## 2017-10-09 DIAGNOSIS — R296 Repeated falls: Secondary | ICD-10-CM | POA: Diagnosis not present

## 2017-10-09 DIAGNOSIS — I10 Essential (primary) hypertension: Secondary | ICD-10-CM | POA: Diagnosis not present

## 2017-10-09 DIAGNOSIS — M6281 Muscle weakness (generalized): Secondary | ICD-10-CM | POA: Diagnosis not present

## 2017-10-09 DIAGNOSIS — F39 Unspecified mood [affective] disorder: Secondary | ICD-10-CM | POA: Diagnosis not present

## 2017-10-10 ENCOUNTER — Ambulatory Visit: Payer: Medicare Other | Admitting: Cardiovascular Disease

## 2017-10-10 DIAGNOSIS — F39 Unspecified mood [affective] disorder: Secondary | ICD-10-CM | POA: Diagnosis not present

## 2017-10-10 DIAGNOSIS — E1165 Type 2 diabetes mellitus with hyperglycemia: Secondary | ICD-10-CM | POA: Diagnosis not present

## 2017-10-10 DIAGNOSIS — N183 Chronic kidney disease, stage 3 (moderate): Secondary | ICD-10-CM | POA: Diagnosis not present

## 2017-10-10 DIAGNOSIS — R296 Repeated falls: Secondary | ICD-10-CM | POA: Diagnosis not present

## 2017-10-10 DIAGNOSIS — M6281 Muscle weakness (generalized): Secondary | ICD-10-CM | POA: Diagnosis not present

## 2017-10-11 DIAGNOSIS — N183 Chronic kidney disease, stage 3 (moderate): Secondary | ICD-10-CM | POA: Diagnosis not present

## 2017-10-11 DIAGNOSIS — M6281 Muscle weakness (generalized): Secondary | ICD-10-CM | POA: Diagnosis not present

## 2017-10-11 DIAGNOSIS — R296 Repeated falls: Secondary | ICD-10-CM | POA: Diagnosis not present

## 2017-10-11 DIAGNOSIS — E1165 Type 2 diabetes mellitus with hyperglycemia: Secondary | ICD-10-CM | POA: Diagnosis not present

## 2017-10-11 DIAGNOSIS — F39 Unspecified mood [affective] disorder: Secondary | ICD-10-CM | POA: Diagnosis not present

## 2017-10-12 DIAGNOSIS — R296 Repeated falls: Secondary | ICD-10-CM | POA: Diagnosis not present

## 2017-10-12 DIAGNOSIS — N183 Chronic kidney disease, stage 3 (moderate): Secondary | ICD-10-CM | POA: Diagnosis not present

## 2017-10-12 DIAGNOSIS — M6281 Muscle weakness (generalized): Secondary | ICD-10-CM | POA: Diagnosis not present

## 2017-10-12 DIAGNOSIS — F39 Unspecified mood [affective] disorder: Secondary | ICD-10-CM | POA: Diagnosis not present

## 2017-10-12 DIAGNOSIS — E1165 Type 2 diabetes mellitus with hyperglycemia: Secondary | ICD-10-CM | POA: Diagnosis not present

## 2017-10-15 DIAGNOSIS — F339 Major depressive disorder, recurrent, unspecified: Secondary | ICD-10-CM | POA: Diagnosis not present

## 2017-10-22 DIAGNOSIS — E119 Type 2 diabetes mellitus without complications: Secondary | ICD-10-CM | POA: Diagnosis not present

## 2017-10-22 DIAGNOSIS — Z794 Long term (current) use of insulin: Secondary | ICD-10-CM | POA: Diagnosis not present

## 2017-10-22 DIAGNOSIS — E7849 Other hyperlipidemia: Secondary | ICD-10-CM | POA: Diagnosis not present

## 2017-10-22 DIAGNOSIS — D518 Other vitamin B12 deficiency anemias: Secondary | ICD-10-CM | POA: Diagnosis not present

## 2017-10-22 DIAGNOSIS — H26493 Other secondary cataract, bilateral: Secondary | ICD-10-CM | POA: Diagnosis not present

## 2017-10-22 DIAGNOSIS — Z7984 Long term (current) use of oral hypoglycemic drugs: Secondary | ICD-10-CM | POA: Diagnosis not present

## 2017-10-22 DIAGNOSIS — E113293 Type 2 diabetes mellitus with mild nonproliferative diabetic retinopathy without macular edema, bilateral: Secondary | ICD-10-CM | POA: Diagnosis not present

## 2017-10-22 DIAGNOSIS — E559 Vitamin D deficiency, unspecified: Secondary | ICD-10-CM | POA: Diagnosis not present

## 2017-10-22 DIAGNOSIS — Z79899 Other long term (current) drug therapy: Secondary | ICD-10-CM | POA: Diagnosis not present

## 2017-10-23 DIAGNOSIS — F339 Major depressive disorder, recurrent, unspecified: Secondary | ICD-10-CM | POA: Diagnosis not present

## 2017-10-29 ENCOUNTER — Encounter: Payer: Self-pay | Admitting: Cardiovascular Disease

## 2017-10-29 ENCOUNTER — Other Ambulatory Visit: Payer: Self-pay

## 2017-10-29 ENCOUNTER — Ambulatory Visit (INDEPENDENT_AMBULATORY_CARE_PROVIDER_SITE_OTHER): Payer: Medicare Other | Admitting: Cardiovascular Disease

## 2017-10-29 VITALS — BP 170/80 | HR 69 | Ht 62.5 in | Wt 213.0 lb

## 2017-10-29 DIAGNOSIS — R600 Localized edema: Secondary | ICD-10-CM

## 2017-10-29 DIAGNOSIS — I10 Essential (primary) hypertension: Secondary | ICD-10-CM

## 2017-10-29 DIAGNOSIS — R9431 Abnormal electrocardiogram [ECG] [EKG]: Secondary | ICD-10-CM

## 2017-10-29 DIAGNOSIS — I350 Nonrheumatic aortic (valve) stenosis: Secondary | ICD-10-CM

## 2017-10-29 DIAGNOSIS — R0609 Other forms of dyspnea: Secondary | ICD-10-CM | POA: Diagnosis not present

## 2017-10-29 MED ORDER — HYDRALAZINE HCL 50 MG PO TABS: 50.0000 mg | ORAL_TABLET | Freq: Three times a day (TID) | ORAL | 6 refills | Status: AC

## 2017-10-29 NOTE — Progress Notes (Signed)
SUBJECTIVE: The patient returns for follow-up after undergoing cardiovascular testing performed for the evaluation of exertional dyspnea and abnormal ECG.  She underwent a low risk nuclear stress test on 08/27/17.  There was extensive soft tissue attenuation artifact.  Echocardiogram demonstrated normal left ventricular systolic function, LVEF 14-97%, grade 1 diastolic dysfunction with elevated filling pressures, moderate concentric and severe focal basal septal hypertrophy, moderate to severe aortic stenosis with a mean gradient of 24 mmHg, mild to moderate mitral regurgitation.  Chronic exertional dyspnea is stable.  She denies chest pain and leg swelling.  I spoke at length with the patient and her daughter about the natural history of calcific aortic valve stenosis and its management.   Review of Systems: As per "subjective", otherwise negative.  Allergies  Allergen Reactions  . Ace Inhibitors Swelling  . Codeine Nausea And Vomiting  . Metformin And Related Diarrhea    Current Outpatient Medications  Medication Sig Dispense Refill  . acetaminophen (TYLENOL) 325 MG tablet Take 650 mg by mouth every 6 (six) hours as needed.    Marland Kitchen albuterol (PROVENTIL HFA;VENTOLIN HFA) 108 (90 Base) MCG/ACT inhaler Inhale into the lungs every 6 (six) hours as needed for wheezing or shortness of breath.    Marland Kitchen alum & mag hydroxide-simeth (MAALOX PLUS) 400-400-40 MG/5ML suspension Take by mouth every 6 (six) hours as needed for indigestion.    Marland Kitchen aspirin 325 MG tablet Take 325 mg by mouth daily.    . barrier cream (NON-SPECIFIED) CREA Apply 1 application topically 2 (two) times daily.    . blood glucose meter kit and supplies KIT Dispense based on patient and insurance preference. Use up to four times daily as directed. (FOR ICD-9 250.00, 250.01). 1 each 0  . busPIRone (BUSPAR) 5 MG tablet Take 5 mg by mouth 2 (two) times daily.    Marland Kitchen Dextromethorphan-Guaifenesin (DELSYM COUGH/CHEST CONGEST DM) 5-100  MG/5ML LIQD Take by mouth.    . dextromethorphan-guaiFENesin (ROBITUSSIN-DM) 10-100 MG/5ML liquid Take by mouth every 4 (four) hours as needed for cough.    . diltiazem (DILACOR XR) 180 MG 24 hr capsule Take 180 mg by mouth daily.    Marland Kitchen diltiazem (TIAZAC) 300 MG 24 hr capsule Take 300 mg by mouth daily.    Marland Kitchen escitalopram (LEXAPRO) 10 MG tablet TAKE ONE TABLET BY MOUTH DAILY. 30 tablet 5  . Fluticasone-Salmeterol (ADVAIR) 250-50 MCG/DOSE AEPB Inhale 1 puff into the lungs 2 (two) times daily.    . hydrALAZINE (APRESOLINE) 25 MG tablet Take 25 mg by mouth 3 (three) times daily.    . insulin detemir (LEVEMIR) 100 UNIT/ML injection Inject 100 Units into the skin at bedtime.    . insulin NPH-regular Human (NOVOLIN 70/30) (70-30) 100 UNIT/ML injection Inject into the skin.    . Lancets 30G MISC 1 each by Does not apply route 2 (two) times daily. 100 each 11  . loratadine (CLARITIN) 10 MG tablet Take 10 mg by mouth daily.    Marland Kitchen LORazepam (ATIVAN) 0.5 MG tablet Take 0.5 mg by mouth every 8 (eight) hours.    . Melatonin 3 MG TABS Take by mouth.    . Oxcarbazepine (TRILEPTAL) 300 MG tablet Take 300 mg by mouth 2 (two) times daily.    . promethazine (PHENERGAN) 25 MG tablet Take 25 mg by mouth every 6 (six) hours as needed for nausea or vomiting.    . ranitidine (ZANTAC) 150 MG tablet Take 150 mg by mouth at bedtime.    Marland Kitchen  rOPINIRole (REQUIP) 1 MG tablet Take 1 mg by mouth at bedtime.    . torsemide (DEMADEX) 20 MG tablet Take 60 mg by mouth daily.    . valsartan (DIOVAN) 320 MG tablet Take 1 tablet (320 mg total) by mouth daily. 30 tablet 0   No current facility-administered medications for this visit.     Past Medical History:  Diagnosis Date  . Asthma   . Depression   . Diabetes mellitus without complication ([place])   . Diabetic retinopathy ([place])   . DJD (degenerative joint disease) of knee   . Hyperlipidemia   . Hypertension   . Insomnia   . OSA on CPAP    10 cwp    Past Surgical History:    Procedure Laterality Date  . APPENDECTOMY    . EYE SURGERY      Social History   Socioeconomic History  . Marital status: Divorced    Spouse name: Not on file  . Number of children: Not on file  . Years of education: Not on file  . Highest education level: Not on file  Social Needs  . Financial resource strain: Not on file  . Food insecurity - worry: Not on file  . Food insecurity - inability: Not on file  . Transportation needs - medical: Not on file  . Transportation needs - non-medical: Not on file  Occupational History  . Not on file  Tobacco Use  . Smoking status: Former Smoker    Types: Cigarettes  . Smokeless tobacco: Never Used  Substance and Sexual Activity  . Alcohol use: No  . Drug use: No  . Sexual activity: Not on file  Other Topics Concern  . Not on file  Social History Narrative  . Not on file     Vitals:   10/29/17 1528  BP: (!) 170/80  Pulse: 69  SpO2: 94%  Weight: 213 lb (96.6 kg)  Height: 5' 2.5" (1.588 m)    Wt Readings from Last 3 Encounters:  10/29/17 213 lb (96.6 kg)  07/31/17 213 lb (96.6 kg)  03/28/16 216 lb 11.4 oz (98.3 kg)     PHYSICAL EXAM General: NAD, using oxygen by nasal cannula HEENT: Normal. Neck: No JVD, no thyromegaly. Lungs: Diminished throughout, no crackles or wheezes. CV: Regular rate and rhythm, normal S1/S2, no S3/S4,  2/6 ejection systolic murmur most prominent over right upper sternal border. No pretibial or periankle edema.   Abdomen: Soft, nontender, protuberant.  Neurologic: Alert and oriented.  Psych: Normal affect. Skin: Normal. Musculoskeletal: No gross deformities.    ECG: Most recent ECG reviewed.   Labs: Lab Results  Component Value Date/Time   K 3.8 03/28/2016 05:41 AM   BUN 25 (H) 03/28/2016 05:41 AM   CREATININE 1.04 (H) 03/28/2016 05:41 AM   CREATININE 1.27 (H) 12/24/2015 02:23 PM   ALT 57 (H) 03/26/2016 05:45 AM   TSH 1.254 03/24/2016 08:49 AM   HGB 9.5 (L) 03/26/2016 05:45 AM      Lipids: Lab Results  Component Value Date/Time   LDLCALC 109 (H) 09/12/2013 10:35 AM   CHOL 187 09/12/2013 10:35 AM   TRIG 104 09/12/2013 10:35 AM   HDL 57 09/12/2013 10:35 AM       ASSESSMENT AND PLAN: 1.  Moderate to severe calcific aortic valve stenosis: I spoke at length with the patient and her daughter about the natural history of calcific aortic valve stenosis and its management. I will monitor this clinically and with routine surveillance echocardiography.  I will consider repeating an echocardiogram in November 2019.   Given her age and comorbidities, I doubt her candidacy for open surgical valve replacement in the future.  She may be a candidate for TAVR in the future.  2.  Hypertension: Markedly elevated today, normal at last visit.  I will increase hydralazine to 50 mg three times daily.  3.  Bilateral leg edema: Currently using compression stockings.  She also takes torsemide. She appears euvolemic.  4.  Exertional dyspnea: Likely multifactorial in etiology with obstructive sleep apnea, cardiovascular deconditioning, obesity hypoventilation syndrome, and moderate to severe aortic stenosis all playing a role.  I will aim to control blood pressure to prevent exertional elevations of left ventricular end-diastolic filling pressures.   Disposition: Follow up 6 months   Kate Sable, M.D., F.A.C.C.

## 2017-10-29 NOTE — Patient Instructions (Signed)
Medication Instructions:   Increase Hydralazine to 50mg  three times per day  Continue all other medications.    Labwork: none  Testing/Procedures: none  Follow-Up: Your physician wants you to follow up in: 6 months.  You will receive a reminder letter in the mail one-two months in advance.  If you don't receive a letter, please call our office to schedule the follow up appointment   Any Other Special Instructions Will Be Listed Below (If Applicable).  If you need a refill on your cardiac medications before your next appointment, please call your pharmacy.

## 2017-10-29 NOTE — Addendum Note (Signed)
Addended by: Laurine Blazer on: 10/29/2017 03:58 PM   Modules accepted: Orders

## 2017-11-05 DIAGNOSIS — E1122 Type 2 diabetes mellitus with diabetic chronic kidney disease: Secondary | ICD-10-CM | POA: Diagnosis not present

## 2017-11-05 DIAGNOSIS — I10 Essential (primary) hypertension: Secondary | ICD-10-CM | POA: Diagnosis not present

## 2017-11-05 DIAGNOSIS — J42 Unspecified chronic bronchitis: Secondary | ICD-10-CM | POA: Diagnosis not present

## 2017-11-05 DIAGNOSIS — K219 Gastro-esophageal reflux disease without esophagitis: Secondary | ICD-10-CM | POA: Diagnosis not present

## 2017-11-20 DIAGNOSIS — B351 Tinea unguium: Secondary | ICD-10-CM | POA: Diagnosis not present

## 2017-11-20 DIAGNOSIS — E1122 Type 2 diabetes mellitus with diabetic chronic kidney disease: Secondary | ICD-10-CM | POA: Diagnosis not present

## 2017-11-20 DIAGNOSIS — L84 Corns and callosities: Secondary | ICD-10-CM | POA: Diagnosis not present

## 2017-11-20 DIAGNOSIS — M79675 Pain in left toe(s): Secondary | ICD-10-CM | POA: Diagnosis not present

## 2017-11-22 DIAGNOSIS — G4700 Insomnia, unspecified: Secondary | ICD-10-CM | POA: Diagnosis not present

## 2017-11-22 DIAGNOSIS — F419 Anxiety disorder, unspecified: Secondary | ICD-10-CM | POA: Diagnosis not present

## 2017-11-22 DIAGNOSIS — F312 Bipolar disorder, current episode manic severe with psychotic features: Secondary | ICD-10-CM | POA: Diagnosis not present

## 2017-12-05 DIAGNOSIS — E1122 Type 2 diabetes mellitus with diabetic chronic kidney disease: Secondary | ICD-10-CM | POA: Diagnosis not present

## 2017-12-05 DIAGNOSIS — K219 Gastro-esophageal reflux disease without esophagitis: Secondary | ICD-10-CM | POA: Diagnosis not present

## 2017-12-05 DIAGNOSIS — J42 Unspecified chronic bronchitis: Secondary | ICD-10-CM | POA: Diagnosis not present

## 2017-12-05 DIAGNOSIS — I10 Essential (primary) hypertension: Secondary | ICD-10-CM | POA: Diagnosis not present

## 2017-12-06 DIAGNOSIS — F339 Major depressive disorder, recurrent, unspecified: Secondary | ICD-10-CM | POA: Diagnosis not present

## 2017-12-17 DIAGNOSIS — F331 Major depressive disorder, recurrent, moderate: Secondary | ICD-10-CM | POA: Diagnosis not present

## 2018-01-02 DIAGNOSIS — J42 Unspecified chronic bronchitis: Secondary | ICD-10-CM | POA: Diagnosis not present

## 2018-01-02 DIAGNOSIS — I10 Essential (primary) hypertension: Secondary | ICD-10-CM | POA: Diagnosis not present

## 2018-01-02 DIAGNOSIS — K219 Gastro-esophageal reflux disease without esophagitis: Secondary | ICD-10-CM | POA: Diagnosis not present

## 2018-01-02 DIAGNOSIS — N183 Chronic kidney disease, stage 3 (moderate): Secondary | ICD-10-CM | POA: Diagnosis not present

## 2018-01-04 DIAGNOSIS — H6983 Other specified disorders of Eustachian tube, bilateral: Secondary | ICD-10-CM | POA: Diagnosis not present

## 2018-01-04 DIAGNOSIS — R112 Nausea with vomiting, unspecified: Secondary | ICD-10-CM | POA: Diagnosis not present

## 2018-01-07 DIAGNOSIS — F3132 Bipolar disorder, current episode depressed, moderate: Secondary | ICD-10-CM | POA: Diagnosis not present

## 2018-01-09 DIAGNOSIS — E119 Type 2 diabetes mellitus without complications: Secondary | ICD-10-CM | POA: Diagnosis not present

## 2018-01-30 DIAGNOSIS — E1122 Type 2 diabetes mellitus with diabetic chronic kidney disease: Secondary | ICD-10-CM | POA: Diagnosis not present

## 2018-01-30 DIAGNOSIS — K219 Gastro-esophageal reflux disease without esophagitis: Secondary | ICD-10-CM | POA: Diagnosis not present

## 2018-01-30 DIAGNOSIS — J42 Unspecified chronic bronchitis: Secondary | ICD-10-CM | POA: Diagnosis not present

## 2018-01-30 DIAGNOSIS — I10 Essential (primary) hypertension: Secondary | ICD-10-CM | POA: Diagnosis not present

## 2018-02-21 DIAGNOSIS — E1122 Type 2 diabetes mellitus with diabetic chronic kidney disease: Secondary | ICD-10-CM | POA: Diagnosis not present

## 2018-02-21 DIAGNOSIS — I10 Essential (primary) hypertension: Secondary | ICD-10-CM | POA: Diagnosis not present

## 2018-02-21 DIAGNOSIS — K219 Gastro-esophageal reflux disease without esophagitis: Secondary | ICD-10-CM | POA: Diagnosis not present

## 2018-02-21 DIAGNOSIS — N183 Chronic kidney disease, stage 3 (moderate): Secondary | ICD-10-CM | POA: Diagnosis not present

## 2018-03-12 DIAGNOSIS — M79675 Pain in left toe(s): Secondary | ICD-10-CM | POA: Diagnosis not present

## 2018-03-12 DIAGNOSIS — E1122 Type 2 diabetes mellitus with diabetic chronic kidney disease: Secondary | ICD-10-CM | POA: Diagnosis not present

## 2018-03-12 DIAGNOSIS — B351 Tinea unguium: Secondary | ICD-10-CM | POA: Diagnosis not present

## 2018-03-18 DIAGNOSIS — N183 Chronic kidney disease, stage 3 (moderate): Secondary | ICD-10-CM | POA: Diagnosis not present

## 2018-03-18 DIAGNOSIS — K219 Gastro-esophageal reflux disease without esophagitis: Secondary | ICD-10-CM | POA: Diagnosis not present

## 2018-03-18 DIAGNOSIS — J42 Unspecified chronic bronchitis: Secondary | ICD-10-CM | POA: Diagnosis not present

## 2018-03-18 DIAGNOSIS — F5105 Insomnia due to other mental disorder: Secondary | ICD-10-CM | POA: Diagnosis not present

## 2018-03-18 DIAGNOSIS — I10 Essential (primary) hypertension: Secondary | ICD-10-CM | POA: Diagnosis not present

## 2018-03-18 DIAGNOSIS — F411 Generalized anxiety disorder: Secondary | ICD-10-CM | POA: Diagnosis not present

## 2018-03-18 DIAGNOSIS — F3132 Bipolar disorder, current episode depressed, moderate: Secondary | ICD-10-CM | POA: Diagnosis not present

## 2018-03-26 DIAGNOSIS — F3132 Bipolar disorder, current episode depressed, moderate: Secondary | ICD-10-CM | POA: Diagnosis not present

## 2018-03-27 DIAGNOSIS — F411 Generalized anxiety disorder: Secondary | ICD-10-CM | POA: Diagnosis not present

## 2018-03-27 DIAGNOSIS — F3132 Bipolar disorder, current episode depressed, moderate: Secondary | ICD-10-CM | POA: Diagnosis not present

## 2018-04-02 DIAGNOSIS — F3132 Bipolar disorder, current episode depressed, moderate: Secondary | ICD-10-CM | POA: Diagnosis not present

## 2018-04-05 DIAGNOSIS — R1110 Vomiting, unspecified: Secondary | ICD-10-CM | POA: Diagnosis not present

## 2018-04-08 DIAGNOSIS — R1110 Vomiting, unspecified: Secondary | ICD-10-CM | POA: Diagnosis not present

## 2018-04-08 DIAGNOSIS — I10 Essential (primary) hypertension: Secondary | ICD-10-CM | POA: Diagnosis not present

## 2018-04-13 DIAGNOSIS — D649 Anemia, unspecified: Secondary | ICD-10-CM | POA: Diagnosis not present

## 2018-04-13 DIAGNOSIS — E785 Hyperlipidemia, unspecified: Secondary | ICD-10-CM | POA: Diagnosis not present

## 2018-04-13 DIAGNOSIS — E119 Type 2 diabetes mellitus without complications: Secondary | ICD-10-CM | POA: Diagnosis not present

## 2018-04-13 DIAGNOSIS — I10 Essential (primary) hypertension: Secondary | ICD-10-CM | POA: Diagnosis not present

## 2018-04-13 DIAGNOSIS — D519 Vitamin B12 deficiency anemia, unspecified: Secondary | ICD-10-CM | POA: Diagnosis not present

## 2018-04-15 DIAGNOSIS — E1122 Type 2 diabetes mellitus with diabetic chronic kidney disease: Secondary | ICD-10-CM | POA: Diagnosis not present

## 2018-04-15 DIAGNOSIS — K219 Gastro-esophageal reflux disease without esophagitis: Secondary | ICD-10-CM | POA: Diagnosis not present

## 2018-04-15 DIAGNOSIS — I10 Essential (primary) hypertension: Secondary | ICD-10-CM | POA: Diagnosis not present

## 2018-04-15 DIAGNOSIS — J42 Unspecified chronic bronchitis: Secondary | ICD-10-CM | POA: Diagnosis not present

## 2018-04-15 DIAGNOSIS — F3132 Bipolar disorder, current episode depressed, moderate: Secondary | ICD-10-CM | POA: Diagnosis not present

## 2018-04-15 DIAGNOSIS — F5105 Insomnia due to other mental disorder: Secondary | ICD-10-CM | POA: Diagnosis not present

## 2018-04-15 DIAGNOSIS — F411 Generalized anxiety disorder: Secondary | ICD-10-CM | POA: Diagnosis not present

## 2018-04-21 DIAGNOSIS — F3132 Bipolar disorder, current episode depressed, moderate: Secondary | ICD-10-CM | POA: Diagnosis not present

## 2018-04-21 DIAGNOSIS — F5105 Insomnia due to other mental disorder: Secondary | ICD-10-CM | POA: Diagnosis not present

## 2018-04-21 DIAGNOSIS — F411 Generalized anxiety disorder: Secondary | ICD-10-CM | POA: Diagnosis not present

## 2018-04-24 DIAGNOSIS — F3132 Bipolar disorder, current episode depressed, moderate: Secondary | ICD-10-CM | POA: Diagnosis not present

## 2018-04-30 DIAGNOSIS — F3132 Bipolar disorder, current episode depressed, moderate: Secondary | ICD-10-CM | POA: Diagnosis not present

## 2018-05-13 DIAGNOSIS — N183 Chronic kidney disease, stage 3 (moderate): Secondary | ICD-10-CM | POA: Diagnosis not present

## 2018-05-13 DIAGNOSIS — K219 Gastro-esophageal reflux disease without esophagitis: Secondary | ICD-10-CM | POA: Diagnosis not present

## 2018-05-13 DIAGNOSIS — J42 Unspecified chronic bronchitis: Secondary | ICD-10-CM | POA: Diagnosis not present

## 2018-05-13 DIAGNOSIS — I10 Essential (primary) hypertension: Secondary | ICD-10-CM | POA: Diagnosis not present

## 2018-05-14 DIAGNOSIS — F3132 Bipolar disorder, current episode depressed, moderate: Secondary | ICD-10-CM | POA: Diagnosis not present

## 2018-05-27 DIAGNOSIS — F5105 Insomnia due to other mental disorder: Secondary | ICD-10-CM | POA: Diagnosis not present

## 2018-05-27 DIAGNOSIS — F3132 Bipolar disorder, current episode depressed, moderate: Secondary | ICD-10-CM | POA: Diagnosis not present

## 2018-05-27 DIAGNOSIS — F411 Generalized anxiety disorder: Secondary | ICD-10-CM | POA: Diagnosis not present

## 2018-05-28 DIAGNOSIS — Z794 Long term (current) use of insulin: Secondary | ICD-10-CM | POA: Diagnosis not present

## 2018-05-28 DIAGNOSIS — H353131 Nonexudative age-related macular degeneration, bilateral, early dry stage: Secondary | ICD-10-CM | POA: Diagnosis not present

## 2018-05-28 DIAGNOSIS — Z7951 Long term (current) use of inhaled steroids: Secondary | ICD-10-CM | POA: Diagnosis not present

## 2018-05-28 DIAGNOSIS — E113293 Type 2 diabetes mellitus with mild nonproliferative diabetic retinopathy without macular edema, bilateral: Secondary | ICD-10-CM | POA: Diagnosis not present

## 2018-06-01 DIAGNOSIS — F411 Generalized anxiety disorder: Secondary | ICD-10-CM | POA: Diagnosis not present

## 2018-06-01 DIAGNOSIS — F5105 Insomnia due to other mental disorder: Secondary | ICD-10-CM | POA: Diagnosis not present

## 2018-06-01 DIAGNOSIS — F3132 Bipolar disorder, current episode depressed, moderate: Secondary | ICD-10-CM | POA: Diagnosis not present

## 2018-06-12 DIAGNOSIS — E1122 Type 2 diabetes mellitus with diabetic chronic kidney disease: Secondary | ICD-10-CM | POA: Diagnosis not present

## 2018-06-12 DIAGNOSIS — K219 Gastro-esophageal reflux disease without esophagitis: Secondary | ICD-10-CM | POA: Diagnosis not present

## 2018-06-12 DIAGNOSIS — J42 Unspecified chronic bronchitis: Secondary | ICD-10-CM | POA: Diagnosis not present

## 2018-06-12 DIAGNOSIS — I10 Essential (primary) hypertension: Secondary | ICD-10-CM | POA: Diagnosis not present

## 2018-06-28 DIAGNOSIS — F5105 Insomnia due to other mental disorder: Secondary | ICD-10-CM | POA: Diagnosis not present

## 2018-06-28 DIAGNOSIS — F411 Generalized anxiety disorder: Secondary | ICD-10-CM | POA: Diagnosis not present

## 2018-06-28 DIAGNOSIS — F3132 Bipolar disorder, current episode depressed, moderate: Secondary | ICD-10-CM | POA: Diagnosis not present

## 2018-07-03 DIAGNOSIS — E1121 Type 2 diabetes mellitus with diabetic nephropathy: Secondary | ICD-10-CM | POA: Diagnosis not present

## 2018-07-03 DIAGNOSIS — B351 Tinea unguium: Secondary | ICD-10-CM | POA: Diagnosis not present

## 2018-07-03 DIAGNOSIS — M79675 Pain in left toe(s): Secondary | ICD-10-CM | POA: Diagnosis not present

## 2018-07-10 DIAGNOSIS — G4733 Obstructive sleep apnea (adult) (pediatric): Secondary | ICD-10-CM | POA: Diagnosis not present

## 2018-07-10 DIAGNOSIS — K219 Gastro-esophageal reflux disease without esophagitis: Secondary | ICD-10-CM | POA: Diagnosis not present

## 2018-07-10 DIAGNOSIS — K3184 Gastroparesis: Secondary | ICD-10-CM | POA: Diagnosis not present

## 2018-07-10 DIAGNOSIS — J42 Unspecified chronic bronchitis: Secondary | ICD-10-CM | POA: Diagnosis not present

## 2018-07-12 DIAGNOSIS — Z23 Encounter for immunization: Secondary | ICD-10-CM | POA: Diagnosis not present

## 2018-07-14 DIAGNOSIS — M1611 Unilateral primary osteoarthritis, right hip: Secondary | ICD-10-CM | POA: Diagnosis not present

## 2018-07-15 DIAGNOSIS — E785 Hyperlipidemia, unspecified: Secondary | ICD-10-CM | POA: Diagnosis not present

## 2018-07-15 DIAGNOSIS — I10 Essential (primary) hypertension: Secondary | ICD-10-CM | POA: Diagnosis not present

## 2018-07-15 DIAGNOSIS — E119 Type 2 diabetes mellitus without complications: Secondary | ICD-10-CM | POA: Diagnosis not present

## 2018-07-15 DIAGNOSIS — D649 Anemia, unspecified: Secondary | ICD-10-CM | POA: Diagnosis not present

## 2018-07-15 DIAGNOSIS — D519 Vitamin B12 deficiency anemia, unspecified: Secondary | ICD-10-CM | POA: Diagnosis not present

## 2018-07-16 DIAGNOSIS — F419 Anxiety disorder, unspecified: Secondary | ICD-10-CM | POA: Diagnosis not present

## 2018-07-17 DIAGNOSIS — G2581 Restless legs syndrome: Secondary | ICD-10-CM | POA: Diagnosis not present

## 2018-07-17 DIAGNOSIS — F419 Anxiety disorder, unspecified: Secondary | ICD-10-CM | POA: Diagnosis not present

## 2018-07-18 DIAGNOSIS — R319 Hematuria, unspecified: Secondary | ICD-10-CM | POA: Diagnosis not present

## 2018-07-18 DIAGNOSIS — N390 Urinary tract infection, site not specified: Secondary | ICD-10-CM | POA: Diagnosis not present

## 2018-07-22 DIAGNOSIS — F5105 Insomnia due to other mental disorder: Secondary | ICD-10-CM | POA: Diagnosis not present

## 2018-07-22 DIAGNOSIS — F3132 Bipolar disorder, current episode depressed, moderate: Secondary | ICD-10-CM | POA: Diagnosis not present

## 2018-07-22 DIAGNOSIS — F411 Generalized anxiety disorder: Secondary | ICD-10-CM | POA: Diagnosis not present

## 2018-07-26 DIAGNOSIS — D649 Anemia, unspecified: Secondary | ICD-10-CM | POA: Diagnosis not present

## 2018-07-26 DIAGNOSIS — I10 Essential (primary) hypertension: Secondary | ICD-10-CM | POA: Diagnosis not present

## 2018-08-02 DIAGNOSIS — R296 Repeated falls: Secondary | ICD-10-CM | POA: Diagnosis not present

## 2018-08-02 DIAGNOSIS — N183 Chronic kidney disease, stage 3 (moderate): Secondary | ICD-10-CM | POA: Diagnosis not present

## 2018-08-02 DIAGNOSIS — K3184 Gastroparesis: Secondary | ICD-10-CM | POA: Diagnosis not present

## 2018-08-02 DIAGNOSIS — M6281 Muscle weakness (generalized): Secondary | ICD-10-CM | POA: Diagnosis not present

## 2018-08-02 DIAGNOSIS — E1165 Type 2 diabetes mellitus with hyperglycemia: Secondary | ICD-10-CM | POA: Diagnosis not present

## 2018-08-02 DIAGNOSIS — F39 Unspecified mood [affective] disorder: Secondary | ICD-10-CM | POA: Diagnosis not present

## 2018-08-05 DIAGNOSIS — F411 Generalized anxiety disorder: Secondary | ICD-10-CM | POA: Diagnosis not present

## 2018-08-05 DIAGNOSIS — E1165 Type 2 diabetes mellitus with hyperglycemia: Secondary | ICD-10-CM | POA: Diagnosis not present

## 2018-08-05 DIAGNOSIS — F39 Unspecified mood [affective] disorder: Secondary | ICD-10-CM | POA: Diagnosis not present

## 2018-08-05 DIAGNOSIS — F5105 Insomnia due to other mental disorder: Secondary | ICD-10-CM | POA: Diagnosis not present

## 2018-08-05 DIAGNOSIS — N183 Chronic kidney disease, stage 3 (moderate): Secondary | ICD-10-CM | POA: Diagnosis not present

## 2018-08-05 DIAGNOSIS — F3132 Bipolar disorder, current episode depressed, moderate: Secondary | ICD-10-CM | POA: Diagnosis not present

## 2018-08-05 DIAGNOSIS — M6281 Muscle weakness (generalized): Secondary | ICD-10-CM | POA: Diagnosis not present

## 2018-08-05 DIAGNOSIS — R296 Repeated falls: Secondary | ICD-10-CM | POA: Diagnosis not present

## 2018-08-06 DIAGNOSIS — M6281 Muscle weakness (generalized): Secondary | ICD-10-CM | POA: Diagnosis not present

## 2018-08-06 DIAGNOSIS — R296 Repeated falls: Secondary | ICD-10-CM | POA: Diagnosis not present

## 2018-08-06 DIAGNOSIS — N183 Chronic kidney disease, stage 3 (moderate): Secondary | ICD-10-CM | POA: Diagnosis not present

## 2018-08-06 DIAGNOSIS — F39 Unspecified mood [affective] disorder: Secondary | ICD-10-CM | POA: Diagnosis not present

## 2018-08-06 DIAGNOSIS — E1165 Type 2 diabetes mellitus with hyperglycemia: Secondary | ICD-10-CM | POA: Diagnosis not present

## 2018-08-07 DIAGNOSIS — N183 Chronic kidney disease, stage 3 (moderate): Secondary | ICD-10-CM | POA: Diagnosis not present

## 2018-08-07 DIAGNOSIS — E1122 Type 2 diabetes mellitus with diabetic chronic kidney disease: Secondary | ICD-10-CM | POA: Diagnosis not present

## 2018-08-07 DIAGNOSIS — R296 Repeated falls: Secondary | ICD-10-CM | POA: Diagnosis not present

## 2018-08-07 DIAGNOSIS — F39 Unspecified mood [affective] disorder: Secondary | ICD-10-CM | POA: Diagnosis not present

## 2018-08-07 DIAGNOSIS — E1165 Type 2 diabetes mellitus with hyperglycemia: Secondary | ICD-10-CM | POA: Diagnosis not present

## 2018-08-07 DIAGNOSIS — I10 Essential (primary) hypertension: Secondary | ICD-10-CM | POA: Diagnosis not present

## 2018-08-07 DIAGNOSIS — M6281 Muscle weakness (generalized): Secondary | ICD-10-CM | POA: Diagnosis not present

## 2018-08-07 DIAGNOSIS — K219 Gastro-esophageal reflux disease without esophagitis: Secondary | ICD-10-CM | POA: Diagnosis not present

## 2018-08-07 DIAGNOSIS — J42 Unspecified chronic bronchitis: Secondary | ICD-10-CM | POA: Diagnosis not present

## 2018-08-08 DIAGNOSIS — M6281 Muscle weakness (generalized): Secondary | ICD-10-CM | POA: Diagnosis not present

## 2018-08-08 DIAGNOSIS — N183 Chronic kidney disease, stage 3 (moderate): Secondary | ICD-10-CM | POA: Diagnosis not present

## 2018-08-08 DIAGNOSIS — F39 Unspecified mood [affective] disorder: Secondary | ICD-10-CM | POA: Diagnosis not present

## 2018-08-08 DIAGNOSIS — R296 Repeated falls: Secondary | ICD-10-CM | POA: Diagnosis not present

## 2018-08-08 DIAGNOSIS — E1165 Type 2 diabetes mellitus with hyperglycemia: Secondary | ICD-10-CM | POA: Diagnosis not present

## 2018-08-09 DIAGNOSIS — N183 Chronic kidney disease, stage 3 (moderate): Secondary | ICD-10-CM | POA: Diagnosis not present

## 2018-08-09 DIAGNOSIS — E1165 Type 2 diabetes mellitus with hyperglycemia: Secondary | ICD-10-CM | POA: Diagnosis not present

## 2018-08-09 DIAGNOSIS — M6281 Muscle weakness (generalized): Secondary | ICD-10-CM | POA: Diagnosis not present

## 2018-08-09 DIAGNOSIS — R296 Repeated falls: Secondary | ICD-10-CM | POA: Diagnosis not present

## 2018-08-09 DIAGNOSIS — F39 Unspecified mood [affective] disorder: Secondary | ICD-10-CM | POA: Diagnosis not present

## 2018-08-12 DIAGNOSIS — R296 Repeated falls: Secondary | ICD-10-CM | POA: Diagnosis not present

## 2018-08-12 DIAGNOSIS — E1165 Type 2 diabetes mellitus with hyperglycemia: Secondary | ICD-10-CM | POA: Diagnosis not present

## 2018-08-12 DIAGNOSIS — M6281 Muscle weakness (generalized): Secondary | ICD-10-CM | POA: Diagnosis not present

## 2018-08-12 DIAGNOSIS — F39 Unspecified mood [affective] disorder: Secondary | ICD-10-CM | POA: Diagnosis not present

## 2018-08-12 DIAGNOSIS — N183 Chronic kidney disease, stage 3 (moderate): Secondary | ICD-10-CM | POA: Diagnosis not present

## 2018-08-13 DIAGNOSIS — N183 Chronic kidney disease, stage 3 (moderate): Secondary | ICD-10-CM | POA: Diagnosis not present

## 2018-08-13 DIAGNOSIS — F39 Unspecified mood [affective] disorder: Secondary | ICD-10-CM | POA: Diagnosis not present

## 2018-08-13 DIAGNOSIS — E1165 Type 2 diabetes mellitus with hyperglycemia: Secondary | ICD-10-CM | POA: Diagnosis not present

## 2018-08-13 DIAGNOSIS — M6281 Muscle weakness (generalized): Secondary | ICD-10-CM | POA: Diagnosis not present

## 2018-08-13 DIAGNOSIS — R296 Repeated falls: Secondary | ICD-10-CM | POA: Diagnosis not present

## 2018-08-14 DIAGNOSIS — F39 Unspecified mood [affective] disorder: Secondary | ICD-10-CM | POA: Diagnosis not present

## 2018-08-14 DIAGNOSIS — E1165 Type 2 diabetes mellitus with hyperglycemia: Secondary | ICD-10-CM | POA: Diagnosis not present

## 2018-08-14 DIAGNOSIS — M6281 Muscle weakness (generalized): Secondary | ICD-10-CM | POA: Diagnosis not present

## 2018-08-14 DIAGNOSIS — N183 Chronic kidney disease, stage 3 (moderate): Secondary | ICD-10-CM | POA: Diagnosis not present

## 2018-08-14 DIAGNOSIS — R296 Repeated falls: Secondary | ICD-10-CM | POA: Diagnosis not present

## 2018-08-15 DIAGNOSIS — M6281 Muscle weakness (generalized): Secondary | ICD-10-CM | POA: Diagnosis not present

## 2018-08-15 DIAGNOSIS — R296 Repeated falls: Secondary | ICD-10-CM | POA: Diagnosis not present

## 2018-08-15 DIAGNOSIS — N183 Chronic kidney disease, stage 3 (moderate): Secondary | ICD-10-CM | POA: Diagnosis not present

## 2018-08-15 DIAGNOSIS — E1165 Type 2 diabetes mellitus with hyperglycemia: Secondary | ICD-10-CM | POA: Diagnosis not present

## 2018-08-15 DIAGNOSIS — F39 Unspecified mood [affective] disorder: Secondary | ICD-10-CM | POA: Diagnosis not present

## 2018-08-16 DIAGNOSIS — N183 Chronic kidney disease, stage 3 (moderate): Secondary | ICD-10-CM | POA: Diagnosis not present

## 2018-08-16 DIAGNOSIS — E1165 Type 2 diabetes mellitus with hyperglycemia: Secondary | ICD-10-CM | POA: Diagnosis not present

## 2018-08-16 DIAGNOSIS — M6281 Muscle weakness (generalized): Secondary | ICD-10-CM | POA: Diagnosis not present

## 2018-08-16 DIAGNOSIS — R296 Repeated falls: Secondary | ICD-10-CM | POA: Diagnosis not present

## 2018-08-16 DIAGNOSIS — F39 Unspecified mood [affective] disorder: Secondary | ICD-10-CM | POA: Diagnosis not present

## 2018-08-19 DIAGNOSIS — E1165 Type 2 diabetes mellitus with hyperglycemia: Secondary | ICD-10-CM | POA: Diagnosis not present

## 2018-08-19 DIAGNOSIS — M6281 Muscle weakness (generalized): Secondary | ICD-10-CM | POA: Diagnosis not present

## 2018-08-19 DIAGNOSIS — R296 Repeated falls: Secondary | ICD-10-CM | POA: Diagnosis not present

## 2018-08-19 DIAGNOSIS — N183 Chronic kidney disease, stage 3 (moderate): Secondary | ICD-10-CM | POA: Diagnosis not present

## 2018-08-19 DIAGNOSIS — F39 Unspecified mood [affective] disorder: Secondary | ICD-10-CM | POA: Diagnosis not present

## 2018-08-20 DIAGNOSIS — M6281 Muscle weakness (generalized): Secondary | ICD-10-CM | POA: Diagnosis not present

## 2018-08-20 DIAGNOSIS — R296 Repeated falls: Secondary | ICD-10-CM | POA: Diagnosis not present

## 2018-08-20 DIAGNOSIS — N183 Chronic kidney disease, stage 3 (moderate): Secondary | ICD-10-CM | POA: Diagnosis not present

## 2018-08-20 DIAGNOSIS — E1165 Type 2 diabetes mellitus with hyperglycemia: Secondary | ICD-10-CM | POA: Diagnosis not present

## 2018-08-20 DIAGNOSIS — F39 Unspecified mood [affective] disorder: Secondary | ICD-10-CM | POA: Diagnosis not present

## 2018-08-21 DIAGNOSIS — R296 Repeated falls: Secondary | ICD-10-CM | POA: Diagnosis not present

## 2018-08-21 DIAGNOSIS — N183 Chronic kidney disease, stage 3 (moderate): Secondary | ICD-10-CM | POA: Diagnosis not present

## 2018-08-21 DIAGNOSIS — E1165 Type 2 diabetes mellitus with hyperglycemia: Secondary | ICD-10-CM | POA: Diagnosis not present

## 2018-08-21 DIAGNOSIS — F39 Unspecified mood [affective] disorder: Secondary | ICD-10-CM | POA: Diagnosis not present

## 2018-08-21 DIAGNOSIS — M6281 Muscle weakness (generalized): Secondary | ICD-10-CM | POA: Diagnosis not present

## 2018-08-22 DIAGNOSIS — N183 Chronic kidney disease, stage 3 (moderate): Secondary | ICD-10-CM | POA: Diagnosis not present

## 2018-08-22 DIAGNOSIS — E1165 Type 2 diabetes mellitus with hyperglycemia: Secondary | ICD-10-CM | POA: Diagnosis not present

## 2018-08-22 DIAGNOSIS — F39 Unspecified mood [affective] disorder: Secondary | ICD-10-CM | POA: Diagnosis not present

## 2018-08-22 DIAGNOSIS — M6281 Muscle weakness (generalized): Secondary | ICD-10-CM | POA: Diagnosis not present

## 2018-08-22 DIAGNOSIS — R296 Repeated falls: Secondary | ICD-10-CM | POA: Diagnosis not present

## 2018-08-23 DIAGNOSIS — R296 Repeated falls: Secondary | ICD-10-CM | POA: Diagnosis not present

## 2018-08-23 DIAGNOSIS — F39 Unspecified mood [affective] disorder: Secondary | ICD-10-CM | POA: Diagnosis not present

## 2018-08-23 DIAGNOSIS — M6281 Muscle weakness (generalized): Secondary | ICD-10-CM | POA: Diagnosis not present

## 2018-08-23 DIAGNOSIS — N183 Chronic kidney disease, stage 3 (moderate): Secondary | ICD-10-CM | POA: Diagnosis not present

## 2018-08-23 DIAGNOSIS — E1165 Type 2 diabetes mellitus with hyperglycemia: Secondary | ICD-10-CM | POA: Diagnosis not present

## 2018-08-25 DIAGNOSIS — M6281 Muscle weakness (generalized): Secondary | ICD-10-CM | POA: Diagnosis not present

## 2018-08-25 DIAGNOSIS — F39 Unspecified mood [affective] disorder: Secondary | ICD-10-CM | POA: Diagnosis not present

## 2018-08-25 DIAGNOSIS — R296 Repeated falls: Secondary | ICD-10-CM | POA: Diagnosis not present

## 2018-08-25 DIAGNOSIS — E1165 Type 2 diabetes mellitus with hyperglycemia: Secondary | ICD-10-CM | POA: Diagnosis not present

## 2018-08-25 DIAGNOSIS — N183 Chronic kidney disease, stage 3 (moderate): Secondary | ICD-10-CM | POA: Diagnosis not present

## 2018-08-26 DIAGNOSIS — R296 Repeated falls: Secondary | ICD-10-CM | POA: Diagnosis not present

## 2018-08-26 DIAGNOSIS — N183 Chronic kidney disease, stage 3 (moderate): Secondary | ICD-10-CM | POA: Diagnosis not present

## 2018-08-26 DIAGNOSIS — F39 Unspecified mood [affective] disorder: Secondary | ICD-10-CM | POA: Diagnosis not present

## 2018-08-26 DIAGNOSIS — M6281 Muscle weakness (generalized): Secondary | ICD-10-CM | POA: Diagnosis not present

## 2018-08-26 DIAGNOSIS — E1165 Type 2 diabetes mellitus with hyperglycemia: Secondary | ICD-10-CM | POA: Diagnosis not present

## 2018-08-27 DIAGNOSIS — R296 Repeated falls: Secondary | ICD-10-CM | POA: Diagnosis not present

## 2018-08-27 DIAGNOSIS — E1165 Type 2 diabetes mellitus with hyperglycemia: Secondary | ICD-10-CM | POA: Diagnosis not present

## 2018-08-27 DIAGNOSIS — M6281 Muscle weakness (generalized): Secondary | ICD-10-CM | POA: Diagnosis not present

## 2018-08-27 DIAGNOSIS — N183 Chronic kidney disease, stage 3 (moderate): Secondary | ICD-10-CM | POA: Diagnosis not present

## 2018-08-27 DIAGNOSIS — F39 Unspecified mood [affective] disorder: Secondary | ICD-10-CM | POA: Diagnosis not present

## 2018-08-28 DIAGNOSIS — E1165 Type 2 diabetes mellitus with hyperglycemia: Secondary | ICD-10-CM | POA: Diagnosis not present

## 2018-08-28 DIAGNOSIS — F39 Unspecified mood [affective] disorder: Secondary | ICD-10-CM | POA: Diagnosis not present

## 2018-08-28 DIAGNOSIS — M6281 Muscle weakness (generalized): Secondary | ICD-10-CM | POA: Diagnosis not present

## 2018-08-28 DIAGNOSIS — N183 Chronic kidney disease, stage 3 (moderate): Secondary | ICD-10-CM | POA: Diagnosis not present

## 2018-08-28 DIAGNOSIS — R296 Repeated falls: Secondary | ICD-10-CM | POA: Diagnosis not present

## 2018-08-31 DIAGNOSIS — F39 Unspecified mood [affective] disorder: Secondary | ICD-10-CM | POA: Diagnosis not present

## 2018-08-31 DIAGNOSIS — E1165 Type 2 diabetes mellitus with hyperglycemia: Secondary | ICD-10-CM | POA: Diagnosis not present

## 2018-08-31 DIAGNOSIS — N183 Chronic kidney disease, stage 3 (moderate): Secondary | ICD-10-CM | POA: Diagnosis not present

## 2018-08-31 DIAGNOSIS — M6281 Muscle weakness (generalized): Secondary | ICD-10-CM | POA: Diagnosis not present

## 2018-08-31 DIAGNOSIS — R296 Repeated falls: Secondary | ICD-10-CM | POA: Diagnosis not present

## 2018-09-04 DIAGNOSIS — J42 Unspecified chronic bronchitis: Secondary | ICD-10-CM | POA: Diagnosis not present

## 2018-09-04 DIAGNOSIS — I10 Essential (primary) hypertension: Secondary | ICD-10-CM | POA: Diagnosis not present

## 2018-09-04 DIAGNOSIS — E1122 Type 2 diabetes mellitus with diabetic chronic kidney disease: Secondary | ICD-10-CM | POA: Diagnosis not present

## 2018-09-04 DIAGNOSIS — K219 Gastro-esophageal reflux disease without esophagitis: Secondary | ICD-10-CM | POA: Diagnosis not present

## 2018-09-10 DIAGNOSIS — F3132 Bipolar disorder, current episode depressed, moderate: Secondary | ICD-10-CM | POA: Diagnosis not present

## 2018-09-11 DIAGNOSIS — F3132 Bipolar disorder, current episode depressed, moderate: Secondary | ICD-10-CM | POA: Diagnosis not present

## 2018-09-11 DIAGNOSIS — F5105 Insomnia due to other mental disorder: Secondary | ICD-10-CM | POA: Diagnosis not present

## 2018-09-11 DIAGNOSIS — F411 Generalized anxiety disorder: Secondary | ICD-10-CM | POA: Diagnosis not present

## 2018-09-24 DIAGNOSIS — F3132 Bipolar disorder, current episode depressed, moderate: Secondary | ICD-10-CM | POA: Diagnosis not present

## 2018-10-01 DIAGNOSIS — R112 Nausea with vomiting, unspecified: Secondary | ICD-10-CM | POA: Diagnosis not present

## 2018-10-02 DIAGNOSIS — I10 Essential (primary) hypertension: Secondary | ICD-10-CM | POA: Diagnosis not present

## 2018-10-02 DIAGNOSIS — J42 Unspecified chronic bronchitis: Secondary | ICD-10-CM | POA: Diagnosis not present

## 2018-10-02 DIAGNOSIS — E1122 Type 2 diabetes mellitus with diabetic chronic kidney disease: Secondary | ICD-10-CM | POA: Diagnosis not present

## 2018-10-02 DIAGNOSIS — K219 Gastro-esophageal reflux disease without esophagitis: Secondary | ICD-10-CM | POA: Diagnosis not present

## 2018-10-08 DIAGNOSIS — F3132 Bipolar disorder, current episode depressed, moderate: Secondary | ICD-10-CM | POA: Diagnosis not present

## 2018-10-10 DIAGNOSIS — J811 Chronic pulmonary edema: Secondary | ICD-10-CM | POA: Diagnosis not present

## 2018-10-14 DIAGNOSIS — J189 Pneumonia, unspecified organism: Secondary | ICD-10-CM | POA: Diagnosis not present

## 2018-10-15 DIAGNOSIS — D649 Anemia, unspecified: Secondary | ICD-10-CM | POA: Diagnosis not present

## 2018-10-15 DIAGNOSIS — D519 Vitamin B12 deficiency anemia, unspecified: Secondary | ICD-10-CM | POA: Diagnosis not present

## 2018-10-15 DIAGNOSIS — E785 Hyperlipidemia, unspecified: Secondary | ICD-10-CM | POA: Diagnosis not present

## 2018-10-15 DIAGNOSIS — I10 Essential (primary) hypertension: Secondary | ICD-10-CM | POA: Diagnosis not present

## 2018-10-15 DIAGNOSIS — E119 Type 2 diabetes mellitus without complications: Secondary | ICD-10-CM | POA: Diagnosis not present

## 2018-10-21 DIAGNOSIS — D649 Anemia, unspecified: Secondary | ICD-10-CM | POA: Diagnosis not present

## 2018-10-21 DIAGNOSIS — I10 Essential (primary) hypertension: Secondary | ICD-10-CM | POA: Diagnosis not present

## 2018-10-22 DIAGNOSIS — F3132 Bipolar disorder, current episode depressed, moderate: Secondary | ICD-10-CM | POA: Diagnosis not present

## 2018-10-23 DIAGNOSIS — F5105 Insomnia due to other mental disorder: Secondary | ICD-10-CM | POA: Diagnosis not present

## 2018-10-23 DIAGNOSIS — F411 Generalized anxiety disorder: Secondary | ICD-10-CM | POA: Diagnosis not present

## 2018-10-23 DIAGNOSIS — F3132 Bipolar disorder, current episode depressed, moderate: Secondary | ICD-10-CM | POA: Diagnosis not present

## 2018-10-29 DIAGNOSIS — F3132 Bipolar disorder, current episode depressed, moderate: Secondary | ICD-10-CM | POA: Diagnosis not present

## 2018-10-30 DIAGNOSIS — M79675 Pain in left toe(s): Secondary | ICD-10-CM | POA: Diagnosis not present

## 2018-10-30 DIAGNOSIS — B351 Tinea unguium: Secondary | ICD-10-CM | POA: Diagnosis not present

## 2018-10-30 DIAGNOSIS — E1122 Type 2 diabetes mellitus with diabetic chronic kidney disease: Secondary | ICD-10-CM | POA: Diagnosis not present

## 2018-11-06 DIAGNOSIS — K3184 Gastroparesis: Secondary | ICD-10-CM | POA: Diagnosis not present

## 2018-11-06 DIAGNOSIS — F3132 Bipolar disorder, current episode depressed, moderate: Secondary | ICD-10-CM | POA: Diagnosis not present

## 2018-11-06 DIAGNOSIS — F411 Generalized anxiety disorder: Secondary | ICD-10-CM | POA: Diagnosis not present

## 2018-11-06 DIAGNOSIS — F5105 Insomnia due to other mental disorder: Secondary | ICD-10-CM | POA: Diagnosis not present

## 2018-11-06 DIAGNOSIS — E1122 Type 2 diabetes mellitus with diabetic chronic kidney disease: Secondary | ICD-10-CM | POA: Diagnosis not present

## 2018-11-06 DIAGNOSIS — K219 Gastro-esophageal reflux disease without esophagitis: Secondary | ICD-10-CM | POA: Diagnosis not present

## 2018-11-28 DIAGNOSIS — R918 Other nonspecific abnormal finding of lung field: Secondary | ICD-10-CM | POA: Diagnosis not present

## 2018-11-28 DIAGNOSIS — I517 Cardiomegaly: Secondary | ICD-10-CM | POA: Diagnosis not present

## 2018-11-30 DIAGNOSIS — F72 Severe intellectual disabilities: Secondary | ICD-10-CM | POA: Diagnosis not present

## 2018-11-30 DIAGNOSIS — F209 Schizophrenia, unspecified: Secondary | ICD-10-CM | POA: Diagnosis not present

## 2018-12-02 DIAGNOSIS — Z79899 Other long term (current) drug therapy: Secondary | ICD-10-CM | POA: Diagnosis not present

## 2018-12-02 DIAGNOSIS — R112 Nausea with vomiting, unspecified: Secondary | ICD-10-CM | POA: Diagnosis not present

## 2018-12-02 DIAGNOSIS — Z7982 Long term (current) use of aspirin: Secondary | ICD-10-CM | POA: Diagnosis not present

## 2018-12-02 DIAGNOSIS — R4781 Slurred speech: Secondary | ICD-10-CM | POA: Diagnosis not present

## 2018-12-02 DIAGNOSIS — G459 Transient cerebral ischemic attack, unspecified: Secondary | ICD-10-CM | POA: Diagnosis not present

## 2018-12-02 DIAGNOSIS — Z794 Long term (current) use of insulin: Secondary | ICD-10-CM | POA: Diagnosis not present

## 2018-12-02 DIAGNOSIS — I10 Essential (primary) hypertension: Secondary | ICD-10-CM | POA: Diagnosis not present

## 2018-12-02 DIAGNOSIS — R4182 Altered mental status, unspecified: Secondary | ICD-10-CM | POA: Diagnosis not present

## 2018-12-02 DIAGNOSIS — R471 Dysarthria and anarthria: Secondary | ICD-10-CM | POA: Diagnosis not present

## 2018-12-03 DIAGNOSIS — F3132 Bipolar disorder, current episode depressed, moderate: Secondary | ICD-10-CM | POA: Diagnosis not present

## 2018-12-03 DIAGNOSIS — R0602 Shortness of breath: Secondary | ICD-10-CM | POA: Diagnosis not present

## 2018-12-03 DIAGNOSIS — I10 Essential (primary) hypertension: Secondary | ICD-10-CM | POA: Diagnosis not present

## 2018-12-06 DIAGNOSIS — R4020 Unspecified coma: Secondary | ICD-10-CM | POA: Diagnosis not present

## 2018-12-06 DIAGNOSIS — R0902 Hypoxemia: Secondary | ICD-10-CM | POA: Diagnosis not present

## 2018-12-06 DIAGNOSIS — I499 Cardiac arrhythmia, unspecified: Secondary | ICD-10-CM | POA: Diagnosis not present

## 2018-12-06 DIAGNOSIS — R404 Transient alteration of awareness: Secondary | ICD-10-CM | POA: Diagnosis not present

## 2019-01-01 DIAGNOSIS — 419620001 Death: Secondary | SNOMED CT | POA: Diagnosis not present

## 2019-01-01 DEATH — deceased

## 2019-05-05 ENCOUNTER — Other Ambulatory Visit: Payer: Self-pay

## 2019-09-03 IMAGING — NM NM MYOCAR MULTI W/SPECT W/WALL MOTION & EF
2 series · 12 of 12 positions shown · non-contrast
Comparison: none

[Series 1: rest · 6.51mm/px · 6 of 64 frames shown]
[frame 6/64]
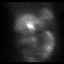
[frame 16/64]
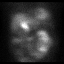
[frame 27/64]
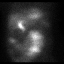
[frame 38/64]
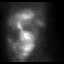
[frame 48/64]
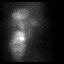
[frame 59/64]
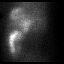

[Series 2: stress gated · 6.51mm/px · 6 of 64 frames shown]
[frame 6/64]
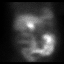
[frame 16/64]
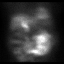
[frame 27/64]
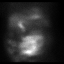
[frame 38/64]
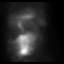
[frame 48/64]
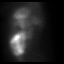
[frame 59/64]
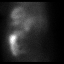

[12 of 12 positions shown; findings below may reference images not displayed]

Canned report from images found in remote index.

Refer to host system for actual result text.
